# Patient Record
Sex: Male | Born: 1955
Health system: Southern US, Community
[De-identification: ages and names within clinical notes are randomized; demographics above are authoritative.]

## PROBLEM LIST (undated history)

## (undated) DIAGNOSIS — K922 Gastrointestinal hemorrhage, unspecified: Secondary | ICD-10-CM

## (undated) DIAGNOSIS — Z8719 Personal history of other diseases of the digestive system: Secondary | ICD-10-CM

## (undated) DIAGNOSIS — N400 Enlarged prostate without lower urinary tract symptoms: Secondary | ICD-10-CM

## (undated) DIAGNOSIS — R972 Elevated prostate specific antigen [PSA]: Secondary | ICD-10-CM

## (undated) DIAGNOSIS — E785 Hyperlipidemia, unspecified: Secondary | ICD-10-CM

## (undated) DIAGNOSIS — R7611 Nonspecific reaction to tuberculin skin test without active tuberculosis: Secondary | ICD-10-CM

## (undated) DIAGNOSIS — R030 Elevated blood-pressure reading, without diagnosis of hypertension: Secondary | ICD-10-CM

## (undated) DIAGNOSIS — M1990 Unspecified osteoarthritis, unspecified site: Secondary | ICD-10-CM

## (undated) DIAGNOSIS — I10 Essential (primary) hypertension: Secondary | ICD-10-CM

## (undated) HISTORY — PX: COLONOSCOPY: SHX174

## (undated) HISTORY — DX: Personal history of other diseases of the digestive system: Z87.19

## (undated) HISTORY — DX: Hyperlipidemia, unspecified: E78.5

## (undated) HISTORY — DX: Elevated blood-pressure reading, without diagnosis of hypertension: R03.0

## (undated) HISTORY — PX: GASTROSCHISIS CLOSURE: SHX1700

## (undated) HISTORY — DX: Benign prostatic hyperplasia without lower urinary tract symptoms: N40.0

## (undated) HISTORY — PX: PROSTATE BIOPSY: SHX241

## (undated) HISTORY — DX: Essential (primary) hypertension: I10

## (undated) HISTORY — DX: Nonspecific reaction to tuberculin skin test without active tuberculosis: R76.11

## (undated) HISTORY — DX: Unspecified osteoarthritis, unspecified site: M19.90

## (undated) HISTORY — DX: Gastrointestinal hemorrhage, unspecified: K92.2

---
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
## 2003-11-07 ENCOUNTER — Inpatient Hospital Stay (HOSPITAL_COMMUNITY): Admission: EM | Admit: 2003-11-07 | Discharge: 2003-11-10 | Payer: Self-pay | Admitting: Emergency Medicine

## 2003-11-23 ENCOUNTER — Encounter: Admission: RE | Admit: 2003-11-23 | Discharge: 2003-11-23 | Payer: Self-pay | Admitting: *Deleted

## 2003-12-14 ENCOUNTER — Encounter: Admission: RE | Admit: 2003-12-14 | Discharge: 2003-12-14 | Payer: Self-pay | Admitting: *Deleted

## 2004-07-10 HISTORY — PX: COLONOSCOPY: SHX174

## 2005-07-04 IMAGING — CR DG ABDOMEN 1V
1 series · 1 of 1 positions shown · non-contrast
Comparison: none

CLINICAL DATA: Rectal bleeding.  
SUPINE VIEW ABDOMEN
The patient had a recent barium enema.  Please see report from such.  Now noted is residual barium throughout the colon.  Exam is limited with respect to evaluating for the possibility of malrotation. Slightly dilated gas filled small bowel loops right lower quadrant of the abdomen. 
IMPRESSION
Residual barium throughout the colon.

[view not recorded]
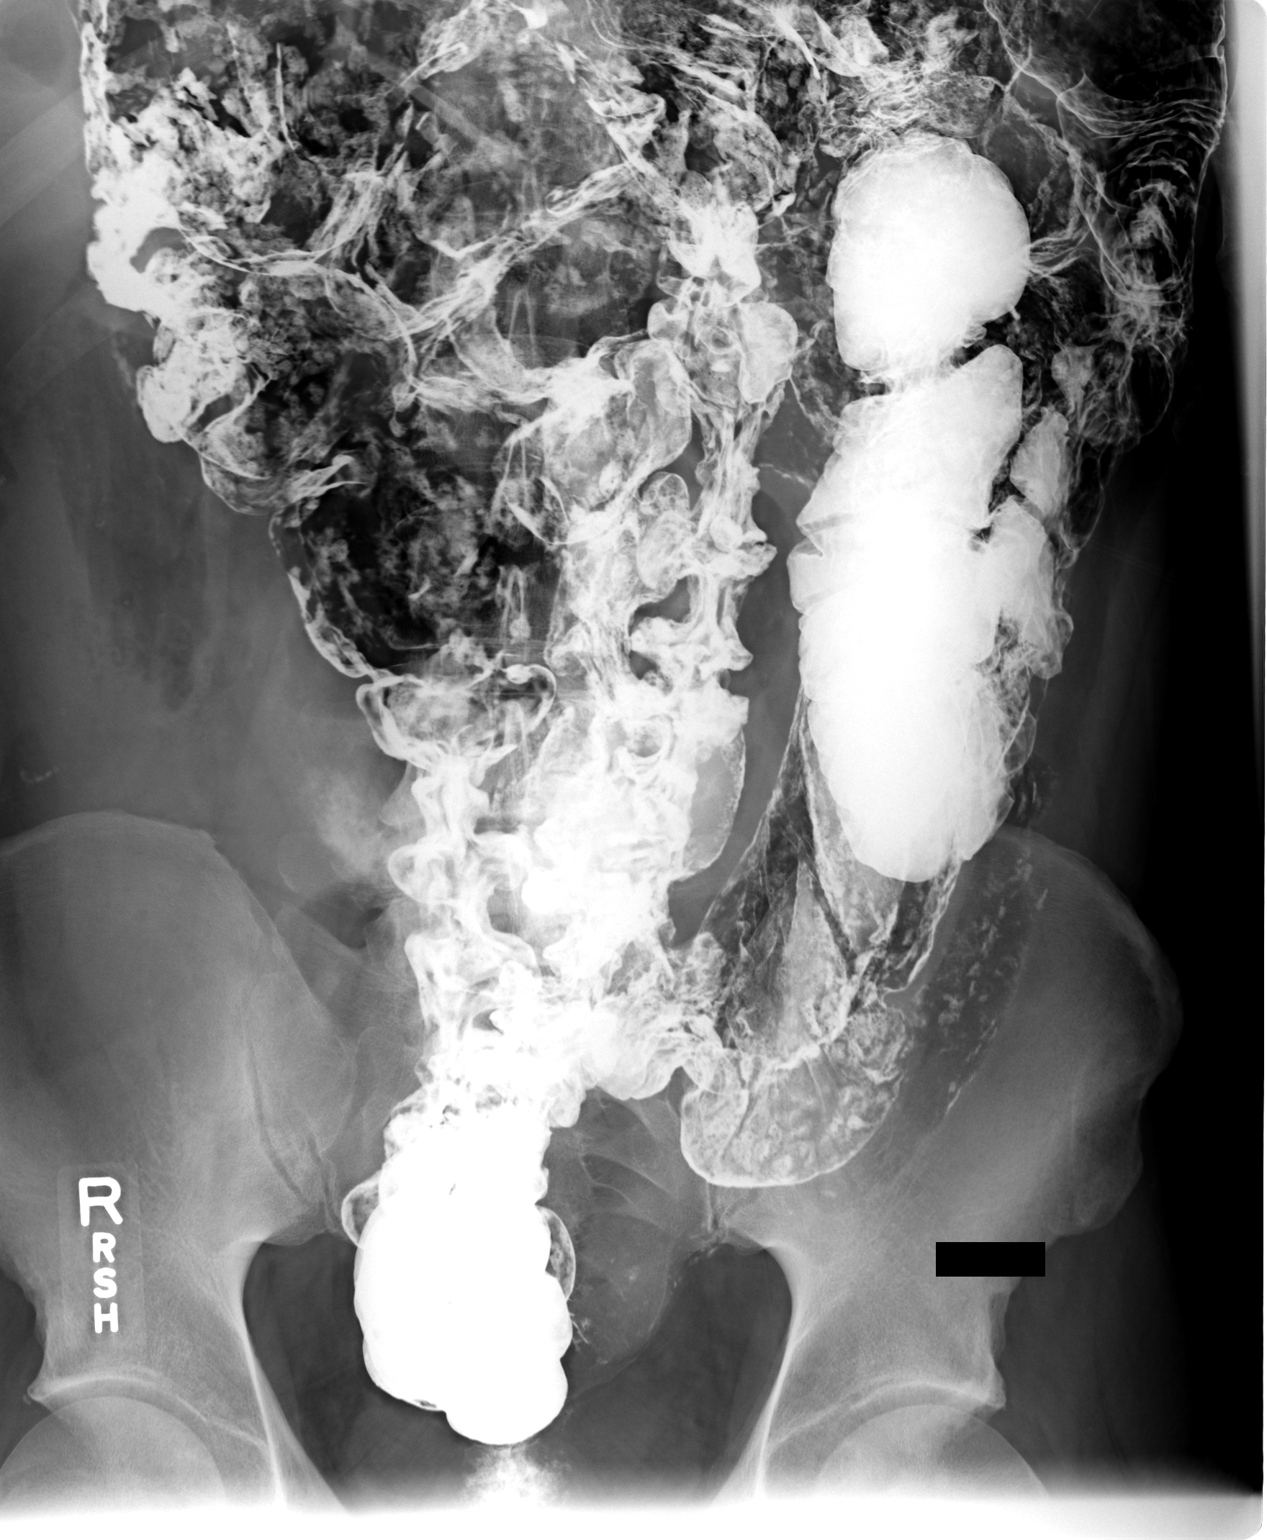

[1 of 1 positions shown; findings below may reference images not displayed]

## 2005-07-18 IMAGING — CR DG ABDOMEN 1V
1 series · 1 of 1 positions shown · non-contrast
Comparison: none

CLINICAL DATA: Hematochezia.
 SINGLE VIEW ABDOMEN ? 11/23/03 
 A supine radiograph of the abdomen was obtained as a preliminary film for a scheduled small bowel series.  This demonstrates stool and retained barium throughout the colon.  Again demonstrated is a more medial location of the right colon than expected, also seen on the barium enema dated 11/09/03.  Unremarkable bones.  
 IMPRESSION
 1.  Stool and retained barium throughout the colon, precluding a detailed small bowel series at this time.  This is being rescheduled following colon cleansing.
 2.  Continued abnormal position of the right colon suggesting malrotation.

[view not recorded]
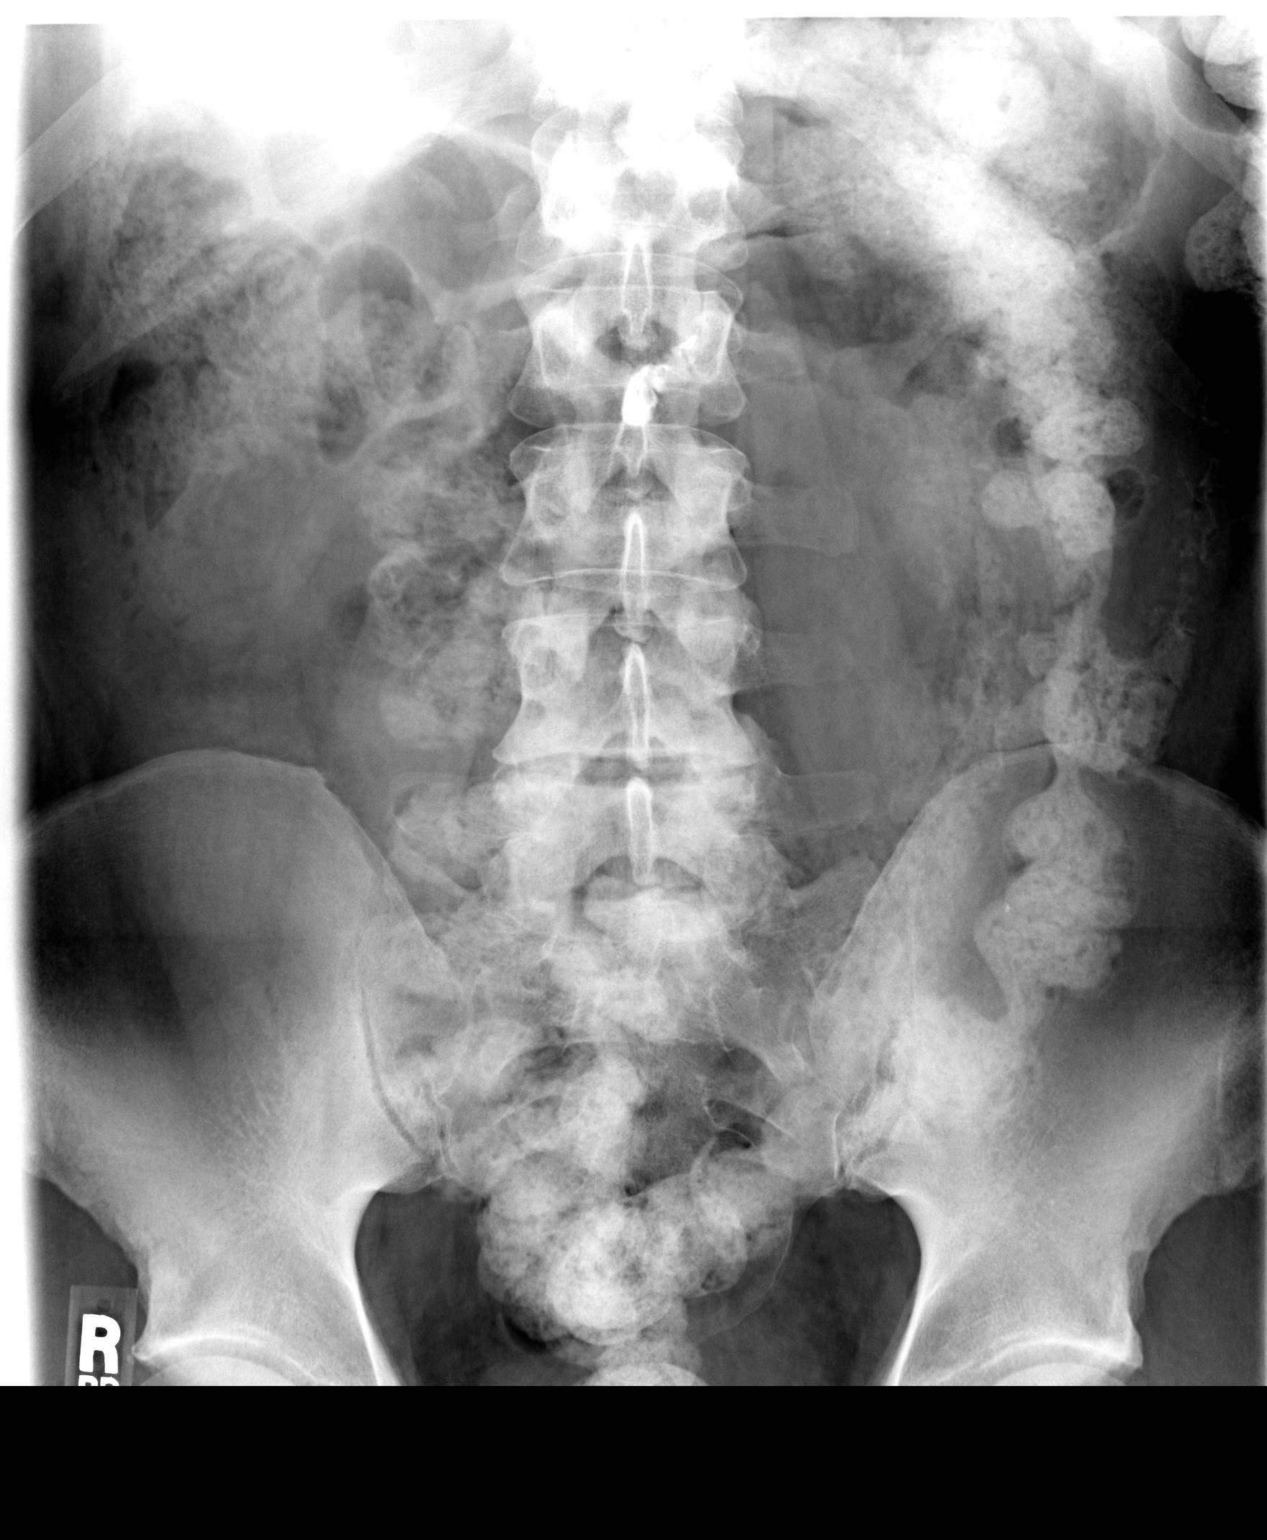

[1 of 1 positions shown; findings below may reference images not displayed]

## 2013-07-10 HISTORY — PX: HEMANGIOMA EXCISION: SHX1734

## 2018-07-10 DIAGNOSIS — R972 Elevated prostate specific antigen [PSA]: Secondary | ICD-10-CM

## 2018-07-10 HISTORY — DX: Elevated prostate specific antigen (PSA): R97.20

## 2018-07-15 ENCOUNTER — Encounter: Payer: Self-pay | Admitting: Family Medicine

## 2018-07-15 ENCOUNTER — Ambulatory Visit: Payer: Self-pay | Admitting: Family Medicine

## 2018-07-15 VITALS — BP 174/71 | HR 73 | Temp 98.2°F | Resp 16 | Ht 73.0 in | Wt 230.4 lb

## 2018-07-15 DIAGNOSIS — E669 Obesity, unspecified: Secondary | ICD-10-CM | POA: Diagnosis not present

## 2018-07-15 DIAGNOSIS — Z0000 Encounter for general adult medical examination without abnormal findings: Secondary | ICD-10-CM

## 2018-07-15 DIAGNOSIS — Z1211 Encounter for screening for malignant neoplasm of colon: Secondary | ICD-10-CM

## 2018-07-15 DIAGNOSIS — Z23 Encounter for immunization: Secondary | ICD-10-CM | POA: Diagnosis not present

## 2018-07-15 DIAGNOSIS — Z125 Encounter for screening for malignant neoplasm of prostate: Secondary | ICD-10-CM

## 2018-07-15 NOTE — Progress Notes (Addendum)
Office Note 07/15/2018  CC:  Chief Complaint  Patient presents with  . Establish Care    Previous PCP: Dr. Barton Dubois at [facility]  . GI Problems  Pt is not fasting today.  HPI:  Michael Cox is a 63 y.o. male who is here to establish care Patient's most recent primary MD: see above.  [facility] for 15+ yrs after seeing Dr. Van Clines not have a PCP there. Old records were not available for review prior to or during today's visit.  His last CPE with labs was approx a couple years ago and was in [facility]. He got a physical through his employer health system. His exercise is working around the yard, nothing formal. Diet: increase in veggies and lower fat food the last couple years. Last eye exam 2 yrs ago, no problems. Dental UTD.  He had gastroschisis surgery at birth.  He had one episode hematochezia; dx's with upper and lower GI bleeding, Hb dropped but no transfusion required.  No surgery required.  Ultimately the bleed was blamed on ASA overuse and he has never bled again.  Pt states he has white coat syndrome.  His bp's at home are 130/70s consistently.  He reports that his bp is always high at MD office and dentist office.   Past Medical History:  Diagnosis Date  . Lower GI bleed    ? due to ASA  . 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  He says he had multiple x-rays and "they never found anything".  Suspect false positive TB skin test??  Marland Kitchen Upper GI bleed    due to use of ASA    Past Surgical History:  Procedure Laterality Date  . COLONOSCOPY  2006  . GASTROSCHISIS CLOSURE     at birth  . HEMANGIOMA EXCISION  2015   from 5th digit right hand  . TONSILLECTOMY  1963    Family History  Problem Relation Age of Onset  . Skin cancer Mother   . Hyperlipidemia Mother   . Heart attack Mother 30  . Lung cancer Father        smoker  . Arthritis Sister   . Alzheimer's disease  Maternal Grandmother   . Heart disease Maternal Grandmother   . Hyperlipidemia Maternal Grandmother   . Hypertension Maternal Grandmother   . Heart attack Maternal Grandmother   . Lung cancer Maternal Grandfather        smoker  . Heart attack Maternal Grandfather   . Stomach cancer Paternal Grandmother   . Heart attack Paternal Grandfather 56    Social History   Socioeconomic History  . Marital status: Married    Spouse name: Not on file  . Number of children: Not on file  . Years of education: Not on file  . Highest education level: Not on file  Occupational History  . Not on file  Social Needs  . Financial resource strain: Not on file  . Food insecurity:    Worry: Not on file    Inability: Not on file  . Transportation needs:    Medical: Not on file    Non-medical: Not on file  Tobacco Use  . Smoking status: Never Smoker  . Smokeless tobacco: Never Used  Substance and Sexual Activity  . Alcohol use: Not on file    Comment: 1-2 glasses of wine daily  . Drug use: Never  . Sexual  activity: Not on file  Lifestyle  . Physical activity:    Days per week: Not on file    Minutes per session: Not on file  . Stress: Not on file  Relationships  . Social connections:    Talks on phone: Not on file    Gets together: Not on file    Attends religious service: Not on file    Active member of club or organization: Not on file    Attends meetings of clubs or organizations: Not on file    Relationship status: Not on file  . Intimate partner violence:    Fear of current or ex partner: Not on file    Emotionally abused: Not on file    Physically abused: Not on file    Forced sexual activity: Not on file  Other Topics Concern  . Not on file  Social History Narrative   Married, 3 sons---[name], Angelia Mouldndrew, Benjamin.   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   No tob/drugs.   Alc: 1 glass wine per evening.    Outpatient  Encounter Medications as of 07/15/2018  Medication Sig  . Multiple Vitamin (MULTIVITAMIN) tablet Take 1 tablet by mouth daily.  . Omega-3 Fatty Acids (FISH OIL PO) Take 1 capsule by mouth daily.   No facility-administered encounter medications on file as of 07/15/2018.     No Known Allergies  ROS Review of Systems  Constitutional: Negative for appetite change, chills, fatigue and fever.  HENT: Negative for congestion, dental problem, ear pain and sore throat.   Eyes: Negative for discharge, redness and visual disturbance.  Respiratory: Negative for cough, chest tightness, shortness of breath and wheezing.   Cardiovascular: Negative for chest pain, palpitations and leg swelling.  Gastrointestinal: Negative for abdominal pain, blood in stool, diarrhea, nausea and vomiting.  Genitourinary: Negative for difficulty urinating, dysuria, flank pain, frequency, hematuria and urgency.  Musculoskeletal: Negative for arthralgias, back pain, joint swelling, myalgias and neck stiffness.  Skin: Negative for pallor and rash.  Neurological: Negative for dizziness, speech difficulty, weakness and headaches.  Hematological: Negative for adenopathy. Does not bruise/bleed easily.  Psychiatric/Behavioral: Negative for confusion and sleep disturbance. The patient is not nervous/anxious.     PE; Blood pressure (!) 174/71, pulse 73, temperature 98.2 F (36.8 C), temperature source Oral, resp. rate 16, height 6\' 1"  (1.854 m), weight 230 lb 6 oz (104.5 kg), SpO2 98 %. Gen: Alert, well appearing.  Patient is oriented to person, place, time, and situation. AFFECT: pleasant, lucid thought and speech. ENT: Ears: EACs clear, normal epithelium.  TMs with good light reflex and landmarks bilaterally.  Eyes: no injection, icteris, swelling, or exudate.  EOMI, PERRLA. Nose: no drainage or turbinate edema/swelling.  No injection or focal lesion.  Mouth: lips without lesion/swelling.  Oral mucosa pink and moist.  Dentition  intact and without obvious caries or gingival swelling.  Oropharynx without erythema, exudate, or swelling.  Neck: supple/nontender.  No LAD, mass, or TM.  Carotid pulses 2+ bilaterally, without bruits. CV: RRR, no m/r/g.   LUNGS: CTA bilat, nonlabored resps, good aeration in all lung fields. ABD: soft, NT, ND, BS normal.  No hepatospenomegaly or mass.  No bruits. EXT: no clubbing, cyanosis, or edema.  Musculoskeletal: no joint swelling, erythema, warmth, or tenderness.  ROM of all joints intact. Skin - no sores or suspicious lesions or rashes or color changes Rectal exam: negative without mass, lesions or tenderness, PROSTATE EXAM: smooth and symmetric without  nodules or tenderness.   Pertinent labs:  None today  ASSESSMENT AND PLAN:   New pt; no old records to obtain.  1) Health maintenance exam: Reviewed age and gender appropriate health maintenance issues (prudent diet, regular exercise, health risks of tobacco and excessive alcohol, use of seatbelts, fire alarms in home, use of sunscreen).  Also reviewed age and gender appropriate health screening as well as vaccine recommendations. Vaccines: Tdap UTD.  Flu vaccine today.  Shingrix #1 given today. Labs: he'll return when fasting for CBC w/diff, CMET, FLP, and PSA. Prostate ca screening: DRE normal today, PSA future. Colon ca screening: last colonoscopy 2006 per pt report.  Will refer to [facility] for repeat colonoscopy.  2) White coat HTN: continue periodic home bp monitoring.   3) Regarding his hx of positive TB test: I am finding out the price of quantiferon gold tb test and the cost of [place]oux skin testing.   If quantiferon gold test is done and is neg-->end of concern. If       "             "                                    Pos-->proceed with CXR.  Pt will need approp tx for latent TB at the very least. If TB skin test is done (b/c Quant gold too expensive) and it returns neg-->end of concern. If       "             "                                                                                     Pos-->then we're right back where we started, and the next step would be to check CXR and if neg then treat for latent TB.  An After Visit Summary was printed and given to the patient.  Return in about 1 year (around 07/16/2019) for annual CPE (fasting)-->also needs fasting lab appt at his earliest convenience.  Signed:  Santiago Bumpers, MD           07/15/2018

## 2018-07-15 NOTE — Patient Instructions (Signed)

## 2018-07-26 ENCOUNTER — Encounter: Payer: Self-pay | Admitting: Gastroenterology

## 2018-07-29 ENCOUNTER — Other Ambulatory Visit (INDEPENDENT_AMBULATORY_CARE_PROVIDER_SITE_OTHER): Payer: BLUE CROSS/BLUE SHIELD

## 2018-07-29 DIAGNOSIS — Z125 Encounter for screening for malignant neoplasm of prostate: Secondary | ICD-10-CM

## 2018-07-29 DIAGNOSIS — Z0000 Encounter for general adult medical examination without abnormal findings: Secondary | ICD-10-CM

## 2018-07-29 LAB — LIPID PANEL
Cholesterol: 202 mg/dL — ABNORMAL HIGH (ref 0–200)
HDL: 41.7 mg/dL (ref >39.00)
NonHDL: 160.56
Total CHOL/HDL Ratio: 5
Triglycerides: 290 mg/dL — ABNORMAL HIGH (ref 0.0–149.0)
VLDL: 58 mg/dL — ABNORMAL HIGH (ref 0.0–40.0)

## 2018-07-29 LAB — CBC WITH DIFFERENTIAL/PLATELET
Basophils Absolute: 0.1 10*3/uL (ref 0.0–0.1)
Basophils Relative: 0.7 % (ref 0.0–3.0)
Eosinophils Absolute: 0.1 10*3/uL (ref 0.0–0.7)
Eosinophils Relative: 1.5 % (ref 0.0–5.0)
HCT: 45.7 % (ref 39.0–52.0)
Hemoglobin: 16 g/dL (ref 13.0–17.0)
Lymphocytes Relative: 43.1 % (ref 12.0–46.0)
Lymphs Abs: 3.7 10*3/uL (ref 0.7–4.0)
MCHC: 35.1 g/dL (ref 30.0–36.0)
MCV: 92.8 fl (ref 78.0–100.0)
Monocytes Absolute: 0.3 10*3/uL (ref 0.1–1.0)
Monocytes Relative: 3.3 % (ref 3.0–12.0)
Neutro Abs: 4.4 10*3/uL (ref 1.4–7.7)
Neutrophils Relative %: 51.4 % (ref 43.0–77.0)
Platelets: 166 10*3/uL (ref 150.0–400.0)
RBC: 4.92 Mil/uL (ref 4.22–5.81)
RDW: 12.5 % (ref 11.5–15.5)
WBC: 8.6 10*3/uL (ref 4.0–10.5)

## 2018-07-29 LAB — LDL CHOLESTEROL, DIRECT: Direct LDL: 125 mg/dL

## 2018-07-29 LAB — COMPREHENSIVE METABOLIC PANEL
ALT: 14 U/L (ref 0–53)
AST: 34 U/L (ref 0–37)
Albumin: 4.1 g/dL (ref 3.5–5.2)
Alkaline Phosphatase: 82 U/L (ref 39–117)
BUN: 8 mg/dL (ref 6–23)
CO2: 29 mEq/L (ref 19–32)
Calcium: 8.9 mg/dL (ref 8.4–10.5)
Chloride: 103 mEq/L (ref 96–112)
Creatinine, Ser: 1.02 mg/dL (ref 0.40–1.50)
GFR: 73.35 mL/min (ref >60.00)
Glucose, Bld: 104 mg/dL — ABNORMAL HIGH (ref 70–99)
Potassium: 4.6 mEq/L (ref 3.5–5.1)
Sodium: 140 mEq/L (ref 135–145)
Total Bilirubin: 0.7 mg/dL (ref 0.2–1.2)
Total Protein: 6.1 g/dL (ref 6.0–8.3)

## 2018-07-29 LAB — PSA: PSA: 4.14 ng/mL — ABNORMAL HIGH (ref 0.10–4.00)

## 2018-07-30 ENCOUNTER — Other Ambulatory Visit: Payer: Self-pay

## 2018-07-30 DIAGNOSIS — R972 Elevated prostate specific antigen [PSA]: Secondary | ICD-10-CM

## 2018-07-30 DIAGNOSIS — E785 Hyperlipidemia, unspecified: Secondary | ICD-10-CM

## 2018-07-30 MED ORDER — ATORVASTATIN CALCIUM 20 MG PO TABS: 20.0000 mg | ORAL_TABLET | Freq: Every day | ORAL | 2 refills | Status: DC

## 2018-08-07 ENCOUNTER — Encounter: Payer: Self-pay | Admitting: Gastroenterology

## 2018-08-07 ENCOUNTER — Ambulatory Visit (AMBULATORY_SURGERY_CENTER): Payer: Self-pay

## 2018-08-07 ENCOUNTER — Other Ambulatory Visit: Payer: Self-pay

## 2018-08-07 ENCOUNTER — Telehealth: Payer: Self-pay

## 2018-08-07 VITALS — Ht 74.0 in | Wt 229.8 lb

## 2018-08-07 DIAGNOSIS — Z1211 Encounter for screening for malignant neoplasm of colon: Secondary | ICD-10-CM

## 2018-08-07 MED ORDER — NA SULFATE-K SULFATE-MG SULF 17.5-3.13-1.6 GM/177ML PO SOLN: 1.0000 | Freq: Once | ORAL | 0 refills | Status: AC

## 2018-08-07 NOTE — Telephone Encounter (Signed)
I think okay to proceed at [facility]. His anatomy is altered and may be a challenging exam from review of prior barium studies, but not a reason to be done at the hospital. Thanks

## 2018-08-07 NOTE — Telephone Encounter (Signed)
Patient had previsit this morning. Pt had last colonoscopy 15 years ago at [place]. Pt went in to er for GI bleed and they did his colonoscopy while in hospital. Pt verbalize he has Gastroschisis. Pt said he has two digestive systems. Is it okay to still have procedure at [facility]?

## 2018-08-07 NOTE — Telephone Encounter (Signed)
Noted, Thanks

## 2018-08-07 NOTE — Progress Notes (Signed)
No egg or soy allergy known to patient  No issues with past sedation with any surgeries  or procedures, no intubation problems  No diet pills per patient No home 02 use per patient  No blood thinners per patient  Pt denies issues with constipation  No A fib or A flutter  EMMI video sent to pt's e mail , pt declined Pt has gastroschisis notified Dr. Adela Lank.

## 2018-08-15 ENCOUNTER — Encounter: Payer: BLUE CROSS/BLUE SHIELD | Admitting: Gastroenterology

## 2018-09-09 ENCOUNTER — Encounter: Payer: BLUE CROSS/BLUE SHIELD | Admitting: Gastroenterology

## 2018-09-30 ENCOUNTER — Telehealth: Payer: Self-pay

## 2018-09-30 NOTE — Telephone Encounter (Signed)
Pt scheduled for a direct colon in the [facility] on 3-26 with Dr. Adela Lank. Due to COVID-19 tenatively rescheduled procedure to 11-08-2018 at 11:30am. LM for pt and asked him to call back to confirm. He has already had previsit.  Will just need to discuss new time frames for arrival and prep.

## 2018-10-03 ENCOUNTER — Encounter: Payer: BLUE CROSS/BLUE SHIELD | Admitting: Gastroenterology

## 2018-10-24 ENCOUNTER — Telehealth: Payer: Self-pay | Admitting: *Deleted

## 2018-10-24 NOTE — Telephone Encounter (Signed)
No answer left message for patient regarding canceling his procedure related to COVID 19. WiIl cancel patient and call back to reschedule MID May. SM

## 2018-10-26 ENCOUNTER — Other Ambulatory Visit: Payer: Self-pay | Admitting: Family Medicine

## 2018-11-08 ENCOUNTER — Encounter: Payer: BLUE CROSS/BLUE SHIELD | Admitting: Gastroenterology

## 2018-11-19 ENCOUNTER — Telehealth: Payer: Self-pay | Admitting: *Deleted

## 2018-11-19 DIAGNOSIS — Z1211 Encounter for screening for malignant neoplasm of colon: Secondary | ICD-10-CM

## 2018-11-19 MED ORDER — NA SULFATE-K SULFATE-MG SULF 17.5-3.13-1.6 GM/177ML PO SOLN: ORAL | 0 refills | Status: DC

## 2018-11-19 NOTE — Telephone Encounter (Signed)
Spoke with patient. Rescheduled his colonoscopy for 12/23/2018 at 10:30 am with Dr.Armbruster. resent suprep rx to pt's pharmacy per his request and mailed new instructions. Pt is aware.

## 2018-12-20 ENCOUNTER — Telehealth: Payer: Self-pay

## 2018-12-20 NOTE — Telephone Encounter (Signed)
Covid-19 screening questions  Have you traveled in the last 14 days? No. If yes where?  Do you now or have you had a fever in the last 14 days? No.  Do you have any respiratory symptoms of shortness of breath or cough now or in the last 14 days? No.  Do you have any family members or close contacts with diagnosed or suspected Covid-19 in the past 14 days? No.  Have you been tested for Covid-19 and found to be positive? No.       

## 2018-12-23 ENCOUNTER — Encounter: Payer: Self-pay | Admitting: Gastroenterology

## 2018-12-23 ENCOUNTER — Ambulatory Visit (AMBULATORY_SURGERY_CENTER): Payer: BC Managed Care – PPO | Admitting: Gastroenterology

## 2018-12-23 ENCOUNTER — Other Ambulatory Visit: Payer: Self-pay

## 2018-12-23 VITALS — BP 131/68 | HR 63 | Temp 98.3°F | Resp 25 | Ht 74.0 in | Wt 229.0 lb

## 2018-12-23 DIAGNOSIS — K922 Gastrointestinal hemorrhage, unspecified: Secondary | ICD-10-CM | POA: Diagnosis not present

## 2018-12-23 DIAGNOSIS — K9189 Other postprocedural complications and disorders of digestive system: Secondary | ICD-10-CM | POA: Diagnosis not present

## 2018-12-23 DIAGNOSIS — Z538 Procedure and treatment not carried out for other reasons: Secondary | ICD-10-CM

## 2018-12-23 DIAGNOSIS — Z1211 Encounter for screening for malignant neoplasm of colon: Secondary | ICD-10-CM | POA: Diagnosis not present

## 2018-12-23 DIAGNOSIS — K529 Noninfective gastroenteritis and colitis, unspecified: Secondary | ICD-10-CM

## 2018-12-23 MED ORDER — SODIUM CHLORIDE 0.9 % IV SOLN: 500.0000 mL | Freq: Once | INTRAVENOUS | Status: DC

## 2018-12-23 NOTE — Op Note (Signed)
[facility] Patient Name: Michael Cox Procedure Date: 12/23/2018 11:14 AM MRN: [id number] Endoscopist: Remo Lipps P. Havery Moros , MD Age: 63 Referring MD:  Date of Birth: 06-Sep-1955 Gender: Male Account #: [id number] Procedure:                Colonoscopy Indications:              Screening for colorectal malignant neoplasm,                            patient reports history of surgery for gastroshisis                            and GI bleeding as a child with altered anatomy Medicines:                Monitored Anesthesia Care Procedure:                Pre-Anesthesia Assessment:                           - Prior to the procedure, a History and Physical                            was performed, and patient medications and                            allergies were reviewed. The patient's tolerance of                            previous anesthesia was also reviewed. The risks                            and benefits of the procedure and the sedation                            options and risks were discussed with the patient.                            All questions were answered, and informed consent                            was obtained. Prior Anticoagulants: The patient has                            taken no previous anticoagulant or antiplatelet                            agents. ASA Grade Assessment: II - A patient with                            mild systemic disease. After reviewing the risks                            and benefits, the patient was deemed in  satisfactory condition to undergo the procedure.                           After obtaining informed consent, the colonoscope                            was passed under direct vision. Throughout the                            procedure, the patient's blood pressure, pulse, and                            oxygen saturations were monitored continuously. The                            Colonoscope  was introduced through the anus with                            the intention of advancing to the cecum. The scope                            was advanced to the transverse colon before the                            procedure was aborted. Medications were given. The                            colonoscopy was technically difficult and complex                            due to inadequate bowel prep. The patient tolerated                            the procedure well. The quality of the bowel                            preparation was poor. The rectum was photographed. Scope In: 11:21:56 AM Scope Out: 11:28:16 AM Total Procedure Duration: 0 hours 6 minutes 20 seconds  Findings:                 The perianal and digital rectal examinations were                            normal.                           A large amount of semi-liquid stool was found in                            the entire colon, interfering with visualization.                            The bowel prep was inadequate for screening  purposes.                           There was evidence of a prior end-to-side                            ileo-colonic anastomosis in the descending colon.                            The anatomy was altered. This was patent and was                            characterized by inflammation and some ulceration.                            Biopsies were taken with a cold forceps for                            histology. However the colon continued proximally                            and was stool filled through the transverse colon.                            The prep was poor and could not traverse the colon                            more proximally due to poor prep.                           The small bowel limb from the anastomosis was                            intubated and was erythematous. Biopsies were taken                            with a cold forceps for  histology.                           Internal hemorrhoids were found during                            retroflexion. The hemorrhoids were mild. Complications:            No immediate complications. Estimated blood loss:                            Minimal. Estimated Blood Loss:     Estimated blood loss was minimal. Impression:               - Preparation of the colon was poor.                           - Stool in the entire examined colon.                           -  Altered anatomy with surgical anastomosis in the                            descending colon entering into small bowel - the                            small bowel and anastomosis were inflamed, biopsies                            taken. The colon continued proximally but could not                            be traversed much due to significant stool burden                            and poor prep.                           - Internal hemorrhoids.                           Unfortunately the bowel prep prohibited safe and                            full evaluation of the colon. Recommendation:           - Patient has a contact number available for                            emergencies. The signs and symptoms of potential                            delayed complications were discussed with the                            patient. Return to normal activities tomorrow.                            Written discharge instructions were provided to the                            patient.                           - Resume previous diet.                           - Continue present medications.                           - Await pathology results.                           - Repeat colonoscopy because the bowel preparation  was suboptimal. Will discuss with the patient if he                            is willing to do a 2 day prep for this exam, versus                            another form of colon cancer  screening. Viviann Spare P. Sakira Dahmer, MD 12/23/2018 11:36:59 AM This report has been signed electronically.

## 2018-12-23 NOTE — Progress Notes (Signed)
Pt's states no medical or surgical changes since previsit or office visit.  Temp CW Vitals JB 

## 2018-12-23 NOTE — Patient Instructions (Addendum)
Please see scheduled appointments.   YOU HAD AN ENDOSCOPIC PROCEDURE TODAY AT [facility]:   Refer to the procedure report that was given to you for any specific questions about what was found during the examination.  If the procedure report does not answer your questions, please call your gastroenterologist to clarify.  If you requested that your care partner not be given the details of your procedure findings, then the procedure report has been included in a sealed envelope for you to review at your convenience later.  YOU SHOULD EXPECT: Some feelings of bloating in the abdomen. Passage of more gas than usual.  Walking can help get rid of the air that was put into your GI tract during the procedure and reduce the bloating. If you had a lower endoscopy (such as a colonoscopy or flexible sigmoidoscopy) you may notice spotting of blood in your stool or on the toilet paper. If you underwent a bowel prep for your procedure, you may not have a normal bowel movement for a few days.  Please Note:  You might notice some irritation and congestion in your nose or some drainage.  This is from the oxygen used during your procedure.  There is no need for concern and it should clear up in a day or so.  SYMPTOMS TO REPORT IMMEDIATELY:   Following lower endoscopy (colonoscopy or flexible sigmoidoscopy):  Excessive amounts of blood in the stool  Significant tenderness or worsening of abdominal pains  Swelling of the abdomen that is new, acute  Fever of 100F or higher  For urgent or emergent issues, a gastroenterologist can be reached at any hour by calling [phone].   DIET:  We do recommend a small meal at first, but then you may proceed to your regular diet.  Drink plenty of fluids but you should avoid alcoholic beverages for 24 hours.  ACTIVITY:  You should plan to take it easy for the rest of today and you should NOT DRIVE or use heavy machinery until tomorrow (because of the  sedation medicines used during the test).    FOLLOW UP: Our staff will call the number listed on your records 48-72 hours following your procedure to check on you and address any questions or concerns that you may have regarding the information given to you following your procedure. If we do not reach you, we will leave a message.  We will attempt to reach you two times.  During this call, we will ask if you have developed any symptoms of COVID 19. If you develop any symptoms (ie: fever, flu-like symptoms, shortness of breath, cough etc.) before then, please call [phone].  If you test positive for Covid 19 in the 2 weeks post procedure, please call and report this information to [place].    If any biopsies were taken you will be contacted by phone or by letter within the next 1-3 weeks.  Please call us at [phone] if you have not heard about the biopsies in 3 weeks.    SIGNATURES/CONFIDENTIALITY: You and/or your care partner have signed paperwork which will be entered into your electronic medical record.  These signatures attest to the fact that that the information above on your After Visit Summary has been reviewed and is understood.  Full responsibility of the confidentiality of this discharge information lies with you and/or your care-partner.

## 2018-12-23 NOTE — Progress Notes (Signed)
Report given to PACU, vss 

## 2018-12-23 NOTE — Progress Notes (Signed)
Called to room to assist during endoscopic procedure.  Patient ID and intended procedure confirmed with present staff. Received instructions for my participation in the procedure from the performing physician.  

## 2018-12-25 ENCOUNTER — Telehealth: Payer: Self-pay

## 2018-12-25 ENCOUNTER — Telehealth: Payer: Self-pay | Admitting: *Deleted

## 2018-12-25 NOTE — Telephone Encounter (Signed)
  Follow up Call-  Call back number 12/23/2018  Post procedure Call Back phone  # [phone]  Permission to leave phone message Yes  Some recent data might be hidden     Left message

## 2018-12-25 NOTE — Telephone Encounter (Signed)
No answer. Name identifier. Message left to call if any questions or concerns and we will make an attempt to call later in the day.

## 2019-01-20 ENCOUNTER — Other Ambulatory Visit: Payer: Self-pay | Admitting: Family Medicine

## 2019-01-20 NOTE — Telephone Encounter (Signed)
LM for pt to CB for fasting lab appointment.

## 2019-01-21 ENCOUNTER — Other Ambulatory Visit: Payer: Self-pay

## 2019-01-21 ENCOUNTER — Ambulatory Visit: Payer: BC Managed Care – PPO | Admitting: *Deleted

## 2019-01-21 VITALS — Ht 75.0 in | Wt 230.0 lb

## 2019-01-21 DIAGNOSIS — Z1211 Encounter for screening for malignant neoplasm of colon: Secondary | ICD-10-CM

## 2019-01-21 MED ORDER — SUPREP BOWEL PREP KIT 17.5-3.13-1.6 GM/177ML PO SOLN: 1.0000 | Freq: Once | ORAL | 0 refills | Status: AC

## 2019-01-21 NOTE — Progress Notes (Signed)
No egg or soy allergy known to patient  No issues with past sedation with any surgeries  or procedures, no intubation problems  No diet pills per patient No home 02 use per patient  No blood thinners per patient  Pt denies issues with constipation  No A fib or A flutter  EMMI video sent to pt's e mail  2 day colon prep as per SA- last colon 6-20 poor prep   Pt verified name, DOB, address and insurance during PV today. Pt mailed instruction packet to included paper to complete and mail back to [facility] with addressed and stamped envelope, Emmi video, copy of consent form to read and not return, and instructions. Suprep $15  coupon mailed in packet. PV completed over the phone. Pt encouraged to call with questions or issues   Pt is aware that care partner will wait in the car during procedure; if they feel like they will be too hot to wait in the car; they may wait in the lobby.  We want them to wear a mask (we do not have any that we can provide them), practice social distancing, and we will check their temperatures when they get here.  I did remind patient that their care partner needs to stay in the parking lot the entire time. Pt will wear mask into building.

## 2019-02-01 ENCOUNTER — Encounter: Payer: Self-pay | Admitting: Family Medicine

## 2019-02-04 ENCOUNTER — Encounter: Payer: BC Managed Care – PPO | Admitting: Gastroenterology

## 2019-02-05 ENCOUNTER — Telehealth: Payer: Self-pay | Admitting: Family Medicine

## 2019-02-05 ENCOUNTER — Encounter: Payer: Self-pay | Admitting: Family Medicine

## 2019-02-05 ENCOUNTER — Other Ambulatory Visit: Payer: Self-pay

## 2019-02-05 ENCOUNTER — Ambulatory Visit (INDEPENDENT_AMBULATORY_CARE_PROVIDER_SITE_OTHER): Payer: BC Managed Care – PPO | Admitting: Family Medicine

## 2019-02-05 DIAGNOSIS — E785 Hyperlipidemia, unspecified: Secondary | ICD-10-CM | POA: Diagnosis not present

## 2019-02-05 DIAGNOSIS — R972 Elevated prostate specific antigen [PSA]: Secondary | ICD-10-CM | POA: Diagnosis not present

## 2019-02-05 LAB — LIPID PANEL
Cholesterol: 148 mg/dL (ref 0–200)
HDL: 48.1 mg/dL (ref >39.00)
LDL Cholesterol: 70 mg/dL (ref 0–99)
NonHDL: 100.29
Total CHOL/HDL Ratio: 3
Triglycerides: 152 mg/dL — ABNORMAL HIGH (ref 0.0–149.0)
VLDL: 30.4 mg/dL (ref 0.0–40.0)

## 2019-02-05 LAB — PSA: PSA: 3.51 ng/mL (ref 0.10–4.00)

## 2019-02-05 NOTE — Telephone Encounter (Signed)
Pls notify pt that his PSA level decreased from 4.1 to 3.5->back into normal range. Also, his cholesterol is MUCH improved->all normal now.   Continue cholesterol med at current dose every day. Plan on labs recheck at CPE in about 6 mo or so.-thx

## 2019-02-06 NOTE — Telephone Encounter (Signed)
Pt was advised and verbalized understanding.

## 2019-02-17 ENCOUNTER — Telehealth: Payer: Self-pay | Admitting: Gastroenterology

## 2019-02-17 NOTE — Telephone Encounter (Signed)

## 2019-02-18 ENCOUNTER — Other Ambulatory Visit: Payer: Self-pay

## 2019-02-18 ENCOUNTER — Encounter: Payer: Self-pay | Admitting: Gastroenterology

## 2019-02-18 ENCOUNTER — Ambulatory Visit (AMBULATORY_SURGERY_CENTER): Payer: BC Managed Care – PPO | Admitting: Gastroenterology

## 2019-02-18 VITALS — BP 119/75 | HR 60 | Temp 98.0°F | Resp 24 | Ht 75.0 in | Wt 230.0 lb

## 2019-02-18 DIAGNOSIS — Z538 Procedure and treatment not carried out for other reasons: Secondary | ICD-10-CM | POA: Diagnosis not present

## 2019-02-18 DIAGNOSIS — Z1211 Encounter for screening for malignant neoplasm of colon: Secondary | ICD-10-CM | POA: Diagnosis not present

## 2019-02-18 MED ORDER — SODIUM CHLORIDE 0.9 % IV SOLN: 500.0000 mL | Freq: Once | INTRAVENOUS | Status: DC

## 2019-02-18 NOTE — Progress Notes (Signed)
Pt's states no medical or surgical changes since previsit or office visit. 

## 2019-02-18 NOTE — Progress Notes (Signed)
To PACU, VSS. Report to Rn.tb 

## 2019-02-18 NOTE — Op Note (Signed)
[place] Patient Name: Suleiman Finigan Procedure Date: 02/18/2019 7:29 AM MRN: [id number] Endoscopist: Remo Lipps P. Havery Moros , MD Age: 63 Referring MD:  Date of Birth: 1956/03/24 Gender: Male Account #: [id number] Procedure:                Colonoscopy Indications:              Screening for colorectal malignant neoplasm,                            history of gastroschisis with surgery as a child                            for bleeding, altered anatomy with small bowel /                            colonic anastomosis in the left colon with colon in                            continuity otherwise, last exam limited by poor                            prep, now here for secreening following double prep Medicines:                Monitored Anesthesia Care Procedure:                Pre-Anesthesia Assessment:                           - Prior to the procedure, a History and Physical                            was performed, and patient medications and                            allergies were reviewed. The patient's tolerance of                            previous anesthesia was also reviewed. The risks                            and benefits of the procedure and the sedation                            options and risks were discussed with the patient.                            All questions were answered, and informed consent                            was obtained. Prior Anticoagulants: The patient has                            taken no previous anticoagulant or antiplatelet  agents. ASA Grade Assessment: II - A patient with                            mild systemic disease. After reviewing the risks                            and benefits, the patient was deemed in                            satisfactory condition to undergo the procedure.                           After obtaining informed consent, the colonoscope                            was passed  under direct vision. Throughout the                            procedure, the patient's blood pressure, pulse, and                            oxygen saturations were monitored continuously. The                            Colonoscope was introduced through the anus with                            the intention of advancing to the cecum. The scope                            was advanced to the hepatic flexure before the                            procedure was aborted. Medications were given. The                            colonoscopy was technically difficult and complex                            due to inadequate bowel prep. The patient tolerated                            the procedure well. The quality of the bowel                            preparation was inadequate. The rectum was                            photographed. Scope In: 7:37:15 AM Scope Out: 7:54:59 AM Total Procedure Duration: 0 hours 17 minutes 44 seconds  Findings:                 The perianal and digital rectal examinations were  normal.                           There was evidence of a prior end-to-side                            ileo-colonic anastomosis in the descending colon.                            This was patent and was characterized by erythema                            and suspected inflammatory polyps. This was                            biopsied on the last exam without any evidence of                            chronicity or IBD.                           The examined small bowel limb had some mild                            erythema but otherwise normal - biopsies not taken                            given this was done on the last exam and benign.                           A large amount of semi-liquid stool was found in                            the entire colon, making visualization difficult.                            Lavage of the area was performed using copious                             amounts of sterile water, resulting in incomplete                            clearance with fair visualization. I could traverse                            the colonoscope up to the hepatic flexure / distal                            ascending colon - no obvious polyps or mass lesions                            noted however the prep became progressively worse  and could not visualize well enough to achieve                            cecal intubation                           The exam was otherwise normal throughout the                            examined colon. While the prep was not adequate                            enough to visualize flat or smaller polyps - no                            obvious large polyps or mass lesions appreciated. Complications:            No immediate complications. Estimated blood loss:                            None. Estimated Blood Loss:     Estimated blood loss: none. Impression:               - Preparation of the colon was inadequate despite                            double prep - unfortunately given the patient's                            anatomy, I do not think he will be able to clear                            his right colon adequately enough for screening                            purposes.                           - Patent end-to-side ileo-colonic anastomosis,                            characterized by erythema and suspected                            inflammatory polyps.                           - The examined portion of the small bowel was                            mildly erythematous.                           Will discuss options with patient, could consider  virtual colonoscopy to exclude large polyps in the                            right colon however again optical colonoscopy                            unlikely to be successful in the future for this                             patient. Recommendation:           - Patient has a contact number available for                            emergencies. The signs and symptoms of potential                            delayed complications were discussed with the                            patient. Return to normal activities tomorrow.                            Written discharge instructions were provided to the                            patient.                           - Resume previous diet.                           - Continue present medications.                           - Will discuss options as outlined above with the                            patient Willaim RayasSteven P. Kayven Aldaco, MD 02/18/2019 8:04:44 AM This report has been signed electronically.

## 2019-02-18 NOTE — Patient Instructions (Signed)
YOU HAD AN ENDOSCOPIC PROCEDURE TODAY AT THE [facility]:   Refer to the procedure report that was given to you for any specific questions about what was found during the examination.  If the procedure report does not answer your questions, please call your gastroenterologist to clarify.  If you requested that your care partner not be given the details of your procedure findings, then the procedure report has been included in a sealed envelope for you to review at your convenience later.  YOU SHOULD EXPECT: Some feelings of bloating in the abdomen. Passage of more gas than usual.  Walking can help get rid of the air that was put into your GI tract during the procedure and reduce the bloating. If you had a lower endoscopy (such as a colonoscopy or flexible sigmoidoscopy) you may notice spotting of blood in your stool or on the toilet paper. If you underwent a bowel prep for your procedure, you may not have a normal bowel movement for a few days.  Please Note:  You might notice some irritation and congestion in your nose or some drainage.  This is from the oxygen used during your procedure.  There is no need for concern and it should clear up in a day or so.  SYMPTOMS TO REPORT IMMEDIATELY:   Following lower endoscopy (colonoscopy or flexible sigmoidoscopy):  Excessive amounts of blood in the stool  Significant tenderness or worsening of abdominal pains  Swelling of the abdomen that is new, acute  Fever of 100F or higher  For urgent or emergent issues, a gastroenterologist can be reached at any hour by calling [phone].   DIET:  We do recommend a small meal at first, but then you may proceed to your regular diet.  Drink plenty of fluids but you should avoid alcoholic beverages for 24 hours.  ACTIVITY:  You should plan to take it easy for the rest of today and you should NOT DRIVE or use heavy machinery until tomorrow (because of the sedation medicines used during the test).     FOLLOW UP: Our staff will call the number listed on your records 48-72 hours following your procedure to check on you and address any questions or concerns that you may have regarding the information given to you following your procedure. If we do not reach you, we will leave a message.  We will attempt to reach you two times.  During this call, we will ask if you have developed any symptoms of COVID 19. If you develop any symptoms (ie: fever, flu-like symptoms, shortness of breath, cough etc.) before then, please call [phone].  If you test positive for Covid 19 in the 2 weeks post procedure, please call and report this information to us.    If any biopsies were taken you will be contacted by phone or by letter within the next 1-3 weeks.  Please call us at [phone] if you have not heard about the biopsies in 3 weeks.    SIGNATURES/CONFIDENTIALITY: You and/or your care partner have signed paperwork which will be entered into your electronic medical record.  These signatures attest to the fact that that the information above on your After Visit Summary has been reviewed and is understood.  Full responsibility of the confidentiality of this discharge information lies with you and/or your care-partner. 

## 2019-02-20 ENCOUNTER — Telehealth: Payer: Self-pay

## 2019-02-20 NOTE — Telephone Encounter (Signed)
  Follow up Call-  Call back number 02/18/2019 12/23/2018  Post procedure Call Back phone  # [phone]  Permission to leave phone message Yes Yes  Some recent data might be hidden     Patient questions:  Do you have a fever, pain , or abdominal swelling? No. Pain Score  0 *  Have you tolerated food without any problems? Yes.    Have you been able to return to your normal activities? Yes.    Do you have any questions about your discharge instructions: Diet   No. Medications  No. Follow up visit  No.  Do you have questions or concerns about your Care? No.  Actions: * If pain score is 4 or above: No action needed, pain <4.  1. Have you developed a fever since your procedure? No  2.   Have you had an respiratory symptoms (SOB or cough) since your procedure? No  3.   Have you tested positive for COVID 19 since your procedure No  4.   Have you had any family members/close contacts diagnosed with the COVID 19 since your procedure?  No   If yes to any of these questions please route to Joylene John, RN and Alphonsa Gin, RN.

## 2019-03-09 ENCOUNTER — Encounter: Payer: Self-pay | Admitting: Family Medicine

## 2019-03-21 DIAGNOSIS — Z20828 Contact with and (suspected) exposure to other viral communicable diseases: Secondary | ICD-10-CM | POA: Diagnosis not present

## 2019-04-25 ENCOUNTER — Other Ambulatory Visit: Payer: Self-pay

## 2019-04-25 MED ORDER — ATORVASTATIN CALCIUM 20 MG PO TABS: 20.0000 mg | ORAL_TABLET | Freq: Every day | ORAL | 1 refills | Status: DC

## 2019-10-28 ENCOUNTER — Other Ambulatory Visit: Payer: Self-pay

## 2019-10-29 ENCOUNTER — Encounter: Payer: Self-pay | Admitting: Family Medicine

## 2019-10-29 ENCOUNTER — Ambulatory Visit (INDEPENDENT_AMBULATORY_CARE_PROVIDER_SITE_OTHER): Payer: BC Managed Care – PPO | Admitting: Family Medicine

## 2019-10-29 VITALS — BP 151/77 | HR 79 | Temp 98.7°F | Resp 16 | Ht 73.0 in | Wt 218.6 lb

## 2019-10-29 DIAGNOSIS — Z87898 Personal history of other specified conditions: Secondary | ICD-10-CM | POA: Diagnosis not present

## 2019-10-29 DIAGNOSIS — Z23 Encounter for immunization: Secondary | ICD-10-CM | POA: Diagnosis not present

## 2019-10-29 DIAGNOSIS — Z125 Encounter for screening for malignant neoplasm of prostate: Secondary | ICD-10-CM

## 2019-10-29 DIAGNOSIS — Z0000 Encounter for general adult medical examination without abnormal findings: Secondary | ICD-10-CM

## 2019-10-29 DIAGNOSIS — E7800 Pure hypercholesterolemia, unspecified: Secondary | ICD-10-CM

## 2019-10-29 DIAGNOSIS — R030 Elevated blood-pressure reading, without diagnosis of hypertension: Secondary | ICD-10-CM | POA: Diagnosis not present

## 2019-10-29 DIAGNOSIS — E669 Obesity, unspecified: Secondary | ICD-10-CM | POA: Insufficient documentation

## 2019-10-29 LAB — COMPREHENSIVE METABOLIC PANEL
ALT: 17 U/L (ref 0–53)
AST: 42 U/L — ABNORMAL HIGH (ref 0–37)
Albumin: 4.5 g/dL (ref 3.5–5.2)
Alkaline Phosphatase: 101 U/L (ref 39–117)
BUN: 9 mg/dL (ref 6–23)
CO2: 29 mEq/L (ref 19–32)
Calcium: 9.5 mg/dL (ref 8.4–10.5)
Chloride: 102 mEq/L (ref 96–112)
Creatinine, Ser: 0.9 mg/dL (ref 0.40–1.50)
GFR: 84.42 mL/min (ref >60.00)
Glucose, Bld: 98 mg/dL (ref 70–99)
Potassium: 4.4 mEq/L (ref 3.5–5.1)
Sodium: 139 mEq/L (ref 135–145)
Total Bilirubin: 0.9 mg/dL (ref 0.2–1.2)
Total Protein: 6.6 g/dL (ref 6.0–8.3)

## 2019-10-29 LAB — LIPID PANEL
Cholesterol: 142 mg/dL (ref 0–200)
HDL: 48.1 mg/dL (ref >39.00)
LDL Cholesterol: 75 mg/dL (ref 0–99)
NonHDL: 93.7
Total CHOL/HDL Ratio: 3
Triglycerides: 92 mg/dL (ref 0.0–149.0)
VLDL: 18.4 mg/dL (ref 0.0–40.0)

## 2019-10-29 LAB — TSH: TSH: 2.11 u[IU]/mL (ref 0.35–4.50)

## 2019-10-29 LAB — PSA, MEDICARE: PSA: 5.04 ng/ml — ABNORMAL HIGH (ref 0.10–4.00)

## 2019-10-29 NOTE — Patient Instructions (Signed)

## 2019-10-29 NOTE — Addendum Note (Signed)
Addended by: Emi Holes D on: 10/29/2019 11:05 AM   Modules accepted: Orders

## 2019-10-29 NOTE — Progress Notes (Signed)
Office Note 10/29/2019  CC:  Chief Complaint  Patient presents with  . Annual Exam    pt is fasting    HPI:  Michael Cox is a 64 y.o.  male who is here for annual health maintenance exam. Hx of white coat HTN--up again here today as per his usual. Home measurements always <130/80.  Walks 2 miles per day on the job. Push mows yard, tree/yard work.  Diet is fair. Got covid vaccine and had much dec PO intake and lost some wt x 5d after initial shot.    Past Medical History:  Diagnosis Date  . Arthritis   . Elevated PSA 07/2018   Repeat 7 mo later->returned to normal range.  . Hyperlipidemia    started statin 07/2018; great response.  . Lower GI bleed    ? due to ASA  . Positive TB test    postive skin test as child; pt was born in the [place], no BCG has been given to him.  Pt reports that he was never required to take any TB treatment.  He says he had multiple x-rays and "they never found anything".  Suspect false positive TB skin test??  No old records available.  Pt is self pay, so the Quant Gold TB test is too cost prohibitive.  No known TB-like illness, no known contacts with TB pt  . Upper GI bleed    due to use of ASA  . White coat syndrome without hypertension     Past Surgical History:  Procedure Laterality Date  . COLONOSCOPY  2006; 12/23/18;02/18/19   12/23/18 colonoscopy aborted due to poor prep and altered anatomy (iliocolonic anastamosis->hx of surgery for gastroschisis). Rpt/double prep-02/2019 still not adequate prep->R col poorly seen->consider virtual clspy. Iliocolonic anast with eryth and inf polyps.  Marland Kitchen GASTROSCHISIS CLOSURE  1955  . HEMANGIOMA EXCISION  2015   from 5th digit right hand  . TONSILLECTOMY  1963    Family History  Problem Relation Age of Onset  . Skin cancer Mother   . Hyperlipidemia Mother   . Heart attack Mother 47  . Lung cancer Father        smoker  . Arthritis Sister   . Alzheimer's disease Maternal Grandmother   . Heart  disease Maternal Grandmother   . Hyperlipidemia Maternal Grandmother   . Hypertension Maternal Grandmother   . Heart attack Maternal Grandmother   . Lung cancer Maternal Grandfather        smoker  . Heart attack Maternal Grandfather   . Stomach cancer Paternal Grandmother   . Heart attack Paternal Grandfather 66  . Colon cancer Neg Hx   . Esophageal cancer Neg Hx   . Rectal cancer Neg Hx   . Colon polyps Neg Hx     Social History   Socioeconomic History  . Marital status: Married    Spouse name: Not on file  . Number of children: Not on file  . Years of education: Not on file  . Highest education level: Not on file  Occupational History  . Not on file  Tobacco Use  . Smoking status: Never Smoker  . Smokeless tobacco: Never Used  Substance and Sexual Activity  . Alcohol use: Yes    Comment: 1-2 glasses of wine daily  . Drug use: Never  . Sexual activity: Not on file  Other Topics Concern  . Not on file  Social History Narrative   Married, 3 sons---[name], [name].   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   No tob/drugs.   Alc: 1 glass wine per evening.   Social Determinants of Health   Financial Resource Strain:   . Difficulty of Paying Living Expenses:   Food Insecurity:   . Worried About Charity fundraiser in the Last Year:   . Arboriculturist in the Last Year:   Transportation Needs:   . Film/video editor (Medical):   Marland Kitchen Lack of Transportation (Non-Medical):   Physical Activity:   . Days of Exercise per Week:   . Minutes of Exercise per Session:   Stress:   . Feeling of Stress :   Social Connections:   . Frequency of Communication with Friends and Family:   . Frequency of Social Gatherings with Friends and Family:   . Attends Religious Services:   . Active Member of Clubs or Organizations:   . Attends Archivist Meetings:   Marland Kitchen Marital Status:   Intimate Partner Violence:   .  Fear of Current or Ex-Partner:   . Emotionally Abused:   Marland Kitchen Physically Abused:   . Sexually Abused:     Outpatient Medications Prior to Visit  Medication Sig Dispense Refill  . atorvastatin (LIPITOR) 20 MG tablet Take 1 tablet (20 mg total) by mouth daily. 90 tablet 1  . Multiple Vitamins-Minerals (CENTRUM ADULTS) TABS Take 1 tablet by mouth daily.    . Omega-3 Fatty Acids (FISH OIL PO) Take 1 capsule by mouth daily. 1200 mg daily    . Multiple Vitamin (MULTIVITAMIN) tablet Take 1 tablet by mouth daily.     Facility-Administered Medications Prior to Visit  Medication Dose Route Frequency Provider Last Rate Last Admin  . 0.9 %  sodium chloride infusion  500 mL Intravenous Once Armbruster, Carlota Raspberry, MD        No Known Allergies  ROS Review of Systems  Constitutional: Negative for appetite change, chills, fatigue and fever.  HENT: Negative for congestion, dental problem, ear pain and sore throat.   Eyes: Negative for discharge, redness and visual disturbance.  Respiratory: Negative for cough, chest tightness, shortness of breath and wheezing.   Cardiovascular: Negative for chest pain, palpitations and leg swelling.  Gastrointestinal: Negative for abdominal pain, blood in stool, diarrhea, nausea and vomiting.  Genitourinary: Negative for difficulty urinating, dysuria, flank pain, frequency, hematuria and urgency.  Musculoskeletal: Negative for arthralgias, back pain, joint swelling, myalgias and neck stiffness.  Skin: Negative for pallor and rash.  Neurological: Negative for dizziness, speech difficulty, weakness and headaches.  Hematological: Negative for adenopathy. Does not bruise/bleed easily.  Psychiatric/Behavioral: Negative for confusion and sleep disturbance. The patient is not nervous/anxious.     PE; Repeat manual bp today: 130/78 Blood pressure (!) 151/77, pulse 79, temperature 98.7 F (37.1 C), temperature source Temporal, resp. rate 16, height 6\' 1"  (1.854 m), weight 218  lb 9.6 oz (99.2 kg), SpO2 98 %. Body mass index is 28.84 kg/m.  Gen: Alert, well appearing.  Patient is oriented to person, place, time, and situation. AFFECT: pleasant, lucid thought and speech. ENT: Ears: EACs clear, normal epithelium.  TMs with good light reflex and landmarks bilaterally.  Eyes: no injection, icteris, swelling, or exudate.  EOMI, PERRLA. Nose: no drainage or turbinate edema/swelling.  No injection or focal lesion.  Mouth: lips without lesion/swelling.  Oral mucosa pink and moist.  Dentition intact and without obvious caries or gingival swelling.  Oropharynx without erythema, exudate, or swelling.  Neck: supple/nontender.  No LAD, mass, or TM.  Carotid pulses 2+ bilaterally, without bruits. CV: RRR, no m/r/g.   LUNGS: CTA bilat, nonlabored resps, good aeration in all lung fields. ABD: soft, NT, ND, BS normal.  No hepatospenomegaly or mass.  No bruits. EXT: no clubbing, cyanosis, or edema.  Musculoskeletal: no joint swelling, erythema, warmth, or tenderness.  ROM of all joints intact. Skin - no sores or suspicious lesions or rashes or color changes   Pertinent labs:  No results found for: TSH Lab Results  Component Value Date   WBC 8.6 07/29/2018   HGB 16.0 07/29/2018   HCT 45.7 07/29/2018   MCV 92.8 07/29/2018   PLT 166.0 07/29/2018   Lab Results  Component Value Date   CREATININE 1.02 07/29/2018   BUN 8 07/29/2018   NA 140 07/29/2018   K 4.6 07/29/2018   CL 103 07/29/2018   CO2 29 07/29/2018   Lab Results  Component Value Date   ALT 14 07/29/2018   AST 34 07/29/2018   ALKPHOS 82 07/29/2018   BILITOT 0.7 07/29/2018   Lab Results  Component Value Date   CHOL 148 02/05/2019   Lab Results  Component Value Date   HDL 48.10 02/05/2019   Lab Results  Component Value Date   LDLCALC 70 02/05/2019   Lab Results  Component Value Date   TRIG 152.0 (H) 02/05/2019   Lab Results  Component Value Date   CHOLHDL 3 02/05/2019   Lab Results  Component  Value Date   PSA 3.51 02/05/2019   PSA 4.14 (H) 07/29/2018    ASSESSMENT AND PLAN:   Health maintenance exam: Reviewed age and gender appropriate health maintenance issues (prudent diet, regular exercise, health risks of tobacco and excessive alcohol, use of seatbelts, fire alarms in home, use of sunscreen).  Also reviewed age and gender appropriate health screening as well as vaccine recommendations. Vaccines: pneumovax 23-> covid 19 mederna x 2.. Otherwise UTD, including shingrix. Labs: fasting HP labs, PSA. Prostate ca screening: hx of elevated PSA.  PSA today. Colon ca screening: 07/2018 no polyps (nearly complete visualization of colon but not quite.  Pt chose not to proceed with virtual colonoscopy)->recall 5 yrs.  An After Visit Summary was printed and given to the patient.  FOLLOW UP:  Return in about 1 year (around 10/28/2020) for annual CPE (fasting).  Signed:  Santiago Bumpers, MD           10/29/2019

## 2019-10-30 ENCOUNTER — Other Ambulatory Visit: Payer: Self-pay | Admitting: Family Medicine

## 2019-10-30 LAB — CBC WITH DIFFERENTIAL/PLATELET
Basophils Absolute: 0 10*3/uL (ref 0.0–0.1)
Basophils Relative: 0.5 % (ref 0.0–3.0)
Eosinophils Absolute: 0.1 10*3/uL (ref 0.0–0.7)
Eosinophils Relative: 0.6 % (ref 0.0–5.0)
HCT: 44.3 % (ref 39.0–52.0)
Hemoglobin: 15.3 g/dL (ref 13.0–17.0)
Lymphocytes Relative: 34.3 % (ref 12.0–46.0)
Lymphs Abs: 2.9 10*3/uL (ref 0.7–4.0)
MCHC: 34.6 g/dL (ref 30.0–36.0)
MCV: 93.8 fl (ref 78.0–100.0)
Monocytes Absolute: 0.4 10*3/uL (ref 0.1–1.0)
Monocytes Relative: 4.1 % (ref 3.0–12.0)
Neutro Abs: 5.2 10*3/uL (ref 1.4–7.7)
Neutrophils Relative %: 60.5 % (ref 43.0–77.0)
Platelets: 157 10*3/uL (ref 150.0–400.0)
RBC: 4.72 Mil/uL (ref 4.22–5.81)
RDW: 14.5 % (ref 11.5–15.5)
WBC: 8.6 10*3/uL (ref 4.0–10.5)

## 2019-10-30 MED ORDER — ATORVASTATIN CALCIUM 20 MG PO TABS: 20.0000 mg | ORAL_TABLET | Freq: Every day | ORAL | 1 refills | Status: DC

## 2020-04-26 NOTE — Progress Notes (Signed)
OFFICE VISIT  04/27/2020  CC: f/u elevated PSA and HLD  HPI:    Patient is a 64 y.o. male who presents for 6 mo f/u hyperlipidemia and elevated PSA. Has hx of white coat syndrome w/out HTN. Last visit was his CPE/labs. My result note on his labs was: "Labs all normal except PSA up a little bit again (5.04 this time, was 3.51 last check 8 mo ago and was 4.14 six mo before that).. Options are repeat in 6 months again OR referral to urologist." He opted to get repeat PSA in 6 mo, which is now.  HLD: tolerating atorvastatin 20mg  qd. Great lipids 61mo ago, hepatic panel normal.  Feeling well. No urinary obstructive sx's. Taking atorva 20mg  qd.  Home bp measurements 140 syst typically, over 70s diast. He admits he doesn't eat low Na diet. Drinks lots of clear fluids.   Past Medical History:  Diagnosis Date  . Arthritis   . Elevated PSA 07/2018   Repeat 7 mo later->returned to normal range.  . Hyperlipidemia    started statin 07/2018; great response.  . Lower GI bleed    ? due to ASA  . Positive TB test    postive skin test as child; pt was born in the 08/2020, no BCG has been given to him.  Pt reports that he was never required to take any TB treatment.  He says he had multiple x-rays and "they never found anything".  Suspect false positive TB skin test??  No old records available.  Pt is self pay, so the Quant Gold TB test is too cost prohibitive.  No known TB-like illness, no known contacts with TB pt  . Upper GI bleed    due to use of ASA  . White coat syndrome without hypertension     Past Surgical History:  Procedure Laterality Date  . COLONOSCOPY  2006; 12/23/18;02/18/19   12/23/18 colonoscopy aborted due to poor prep and altered anatomy (iliocolonic anastamosis->hx of surgery for gastroschisis). Rpt/double prep-02/2019 still not adequate prep->R col poorly seen->consider virtual clspy. Iliocolonic anast with eryth and inf polyps.  12/25/18 GASTROSCHISIS CLOSURE  1955  . HEMANGIOMA  EXCISION  2015   from 5th digit right hand  . TONSILLECTOMY  1963    Outpatient Medications Prior to Visit  Medication Sig Dispense Refill  . atorvastatin (LIPITOR) 20 MG tablet Take 1 tablet (20 mg total) by mouth daily. 90 tablet 1  . Multiple Vitamins-Minerals (CENTRUM ADULTS) TABS Take 1 tablet by mouth daily.    . Omega-3 Fatty Acids (FISH OIL PO) Take 1 capsule by mouth daily. 1200 mg daily     Facility-Administered Medications Prior to Visit  Medication Dose Route Frequency Provider Last Rate Last Admin  . 0.9 %  sodium chloride infusion  500 mL Intravenous Once Armbruster, 03-17-1993, MD        No Known Allergies  ROS As per HPI  PE: Vitals with BMI 04/27/2020 10/29/2019 02/18/2019  Height - 6\' 1"  -  Weight 222 lbs 10 oz 218 lbs 10 oz -  BMI - 28.85 -  Systolic 171 151 10/31/2019  Diastolic 81 77 75  Pulse 69 79 60  Manual bp repeat at end of visit today was 120/82   Gen: Alert, well appearing.  Patient is oriented to person, place, time, and situation. AFFECT: pleasant, lucid thought and speech. CV: RRR, no m/r/g.   LUNGS: CTA bilat, nonlabored resps, good aeration in all lung fields. EXT: no clubbing or cyanosis.  1+ bilat LL pitting edema.    LABS:  Lab Results  Component Value Date   TSH 2.11 10/29/2019   Lab Results  Component Value Date   WBC 8.6 10/29/2019   HGB 15.3 10/29/2019   HCT 44.3 10/29/2019   MCV 93.8 10/29/2019   PLT 157.0 10/29/2019   Lab Results  Component Value Date   CREATININE 0.90 10/29/2019   BUN 9 10/29/2019   NA 139 10/29/2019   K 4.4 10/29/2019   CL 102 10/29/2019   CO2 29 10/29/2019   Lab Results  Component Value Date   ALT 17 10/29/2019   AST 42 (H) 10/29/2019   ALKPHOS 101 10/29/2019   BILITOT 0.9 10/29/2019   Lab Results  Component Value Date   CHOL 142 10/29/2019   Lab Results  Component Value Date   HDL 48.10 10/29/2019   Lab Results  Component Value Date   LDLCALC 75 10/29/2019   Lab Results  Component  Value Date   TRIG 92.0 10/29/2019   Lab Results  Component Value Date   CHOLHDL 3 10/29/2019   Lab Results  Component Value Date   PSA 5.04 (H) 10/29/2019   PSA 3.51 02/05/2019   PSA 4.14 (H) 07/29/2018    IMPRESSION AND PLAN:  1) Elevated PSA, mild. Repeat today and if going up then we'll refer to urology.  2) HLD: tolerating atorva 20mg  qd, lipids and hepatic panel 6 mo ago excellent. Continue this.  3) HTN, home bp's show consistent systolic elevation avg 140 or so. Has white coat component. DASH diet handout reviewed/given to patient. Start hctz 25mg . Instructions: Take 1/2 tab of your new blood pressure medication (HCTZ) once a day. If blood pressure staying consistently over 130 on top after doing this for 5 days then increase to 1 whole tab daily.  An After Visit Summary was printed and given to the patient.  FOLLOW UP: Return in about 2 weeks (around 05/11/2020) for f/u HTN/bmet (non-fasting).  Signed:  , MD           04/27/2020

## 2020-04-27 ENCOUNTER — Other Ambulatory Visit: Payer: Self-pay

## 2020-04-27 ENCOUNTER — Encounter: Payer: Self-pay | Admitting: Family Medicine

## 2020-04-27 ENCOUNTER — Other Ambulatory Visit: Payer: Self-pay | Admitting: Family Medicine

## 2020-04-27 ENCOUNTER — Ambulatory Visit (INDEPENDENT_AMBULATORY_CARE_PROVIDER_SITE_OTHER): Payer: BC Managed Care – PPO | Admitting: Family Medicine

## 2020-04-27 VITALS — BP 120/82 | HR 69 | Temp 98.1°F | Resp 16 | Ht 73.0 in | Wt 222.6 lb

## 2020-04-27 DIAGNOSIS — E7800 Pure hypercholesterolemia, unspecified: Secondary | ICD-10-CM

## 2020-04-27 DIAGNOSIS — I10 Essential (primary) hypertension: Secondary | ICD-10-CM | POA: Diagnosis not present

## 2020-04-27 DIAGNOSIS — R972 Elevated prostate specific antigen [PSA]: Secondary | ICD-10-CM | POA: Diagnosis not present

## 2020-04-27 DIAGNOSIS — Z125 Encounter for screening for malignant neoplasm of prostate: Secondary | ICD-10-CM

## 2020-04-27 DIAGNOSIS — Z23 Encounter for immunization: Secondary | ICD-10-CM | POA: Diagnosis not present

## 2020-04-27 LAB — PSA, MEDICARE: PSA: 3.7 ng/ml (ref 0.10–4.00)

## 2020-04-27 MED ORDER — HYDROCHLOROTHIAZIDE 25 MG PO TABS: 25.0000 mg | ORAL_TABLET | Freq: Every day | ORAL | 1 refills | Status: DC

## 2020-04-27 NOTE — Addendum Note (Signed)
Addended by: Emi Holes D on: 04/27/2020 09:03 AM   Modules accepted: Orders

## 2020-04-27 NOTE — Patient Instructions (Addendum)
Take 1/2 tab of your new blood pressure medication (HCTZ) once a day. If blood pressure staying consistently over 130 on top after doing this for 5 days then increase to 1 whole tab daily.   DASH Eating Plan DASH stands for "Dietary Approaches to Stop Hypertension." The DASH eating plan is a healthy eating plan that has been shown to reduce high blood pressure (hypertension). It may also reduce your risk for type 2 diabetes, heart disease, and stroke. The DASH eating plan may also help with weight loss. What are tips for following this plan?  General guidelines  Avoid eating more than 2,300 mg (milligrams) of salt (sodium) a day. If you have hypertension, you may need to reduce your sodium intake to 1,500 mg a day.  Limit alcohol intake to no more than 1 drink a day for nonpregnant women and 2 drinks a day for men. One drink equals 12 oz of beer, 5 oz of wine, or 1 oz of hard liquor.  Work with your health care provider to maintain a healthy body weight or to lose weight. Ask what an ideal weight is for you.  Get at least 30 minutes of exercise that causes your heart to beat faster (aerobic exercise) most days of the week. Activities may include walking, swimming, or biking.  Work with your health care provider or diet and nutrition specialist (dietitian) to adjust your eating plan to your individual calorie needs. Reading food labels   Check food labels for the amount of sodium per serving. Choose foods with less than 5 percent of the Daily Value of sodium. Generally, foods with less than 300 mg of sodium per serving fit into this eating plan.  To find whole grains, look for the word "whole" as the first word in the ingredient list. Shopping  Buy products labeled as "low-sodium" or "no salt added."  Buy fresh foods. Avoid canned foods and premade or frozen meals. Cooking  Avoid adding salt when cooking. Use salt-free seasonings or herbs instead of table salt or sea salt. Check with  your health care provider or pharmacist before using salt substitutes.  Do not fry foods. Cook foods using healthy methods such as baking, boiling, grilling, and broiling instead.  Cook with heart-healthy oils, such as olive, canola, soybean, or sunflower oil. Meal planning  Eat a balanced diet that includes: ? 5 or more servings of fruits and vegetables each day. At each meal, try to fill half of your plate with fruits and vegetables. ? Up to 6-8 servings of whole grains each day. ? Less than 6 oz of lean meat, poultry, or fish each day. A 3-oz serving of meat is about the same size as a deck of cards. One egg equals 1 oz. ? 2 servings of low-fat dairy each day. ? A serving of nuts, seeds, or beans 5 times each week. ? Heart-healthy fats. Healthy fats called Omega-3 fatty acids are found in foods such as flaxseeds and coldwater fish, like sardines, salmon, and mackerel.  Limit how much you eat of the following: ? Canned or prepackaged foods. ? Food that is high in trans fat, such as fried foods. ? Food that is high in saturated fat, such as fatty meat. ? Sweets, desserts, sugary drinks, and other foods with added sugar. ? Full-fat dairy products.  Do not salt foods before eating.  Try to eat at least 2 vegetarian meals each week.  Eat more home-cooked food and less restaurant, buffet, and fast food.  When eating at a restaurant, ask that your food be prepared with less salt or no salt, if possible. What foods are recommended? The items listed may not be a complete list. Talk with your dietitian about what dietary choices are best for you. Grains Whole-grain or whole-wheat bread. Whole-grain or whole-wheat pasta. Brown rice. [place]. Bulgur. Whole-grain and low-sodium cereals. Pita bread. Low-fat, low-sodium crackers. Whole-wheat flour tortillas. Vegetables Fresh or frozen vegetables (raw, steamed, roasted, or grilled). Low-sodium or reduced-sodium tomato and vegetable  juice. Low-sodium or reduced-sodium tomato sauce and tomato paste. Low-sodium or reduced-sodium canned vegetables. Fruits All fresh, dried, or frozen fruit. Canned fruit in natural juice (without added sugar). Meat and other protein foods Skinless chicken or [place]. Ground chicken or [place]. Pork with fat trimmed off. Fish and seafood. Egg whites. Dried beans, peas, or lentils. Unsalted nuts, nut butters, and seeds. Unsalted canned beans. Lean cuts of beef with fat trimmed off. Low-sodium, lean deli meat. Dairy Low-fat (1%) or fat-free (skim) milk. Fat-free, low-fat, or reduced-fat cheeses. Nonfat, low-sodium ricotta or cottage cheese. Low-fat or nonfat yogurt. Low-fat, low-sodium cheese. Fats and oils Soft margarine without trans fats. Vegetable oil. Low-fat, reduced-fat, or light mayonnaise and salad dressings (reduced-sodium). Canola, safflower, olive, soybean, and sunflower oils. Avocado. Seasoning and other foods Herbs. Spices. Seasoning mixes without salt. Unsalted popcorn and pretzels. Fat-free sweets. What foods are not recommended? The items listed may not be a complete list. Talk with your dietitian about what dietary choices are best for you. Grains Baked goods made with fat, such as croissants, muffins, or some breads. Dry pasta or rice meal packs. Vegetables Creamed or fried vegetables. Vegetables in a cheese sauce. Regular canned vegetables (not low-sodium or reduced-sodium). Regular canned tomato sauce and paste (not low-sodium or reduced-sodium). Regular tomato and vegetable juice (not low-sodium or reduced-sodium). Angie Fava. Olives. Fruits Canned fruit in a light or heavy syrup. Fried fruit. Fruit in cream or butter sauce. Meat and other protein foods Fatty cuts of meat. Ribs. Fried meat. Berniece Salines. Sausage. Bologna and other processed lunch meats. Salami. Fatback. Hotdogs. Bratwurst. Salted nuts and seeds. Canned beans with added salt. Canned or smoked fish. Whole eggs or egg yolks.  Chicken or [place] with skin. Dairy Whole or 2% milk, cream, and half-and-half. Whole or full-fat cream cheese. Whole-fat or sweetened yogurt. Full-fat cheese. Nondairy creamers. Whipped toppings. Processed cheese and cheese spreads. Fats and oils Butter. Stick margarine. Lard. Shortening. Ghee. Bacon fat. Tropical oils, such as coconut, palm kernel, or palm oil. Seasoning and other foods Salted popcorn and pretzels. Onion salt, garlic salt, seasoned salt, table salt, and sea salt. Worcestershire sauce. Tartar sauce. Barbecue sauce. Teriyaki sauce. Soy sauce, including reduced-sodium. Steak sauce. Canned and packaged gravies. Fish sauce. Oyster sauce. Cocktail sauce. Horseradish that you find on the shelf. Ketchup. Mustard. Meat flavorings and tenderizers. Bouillon cubes. Hot sauce and Tabasco sauce. Premade or packaged marinades. Premade or packaged taco seasonings. Relishes. Regular salad dressings. Where to find more information:  National Heart, Lung, and [place]: https://wilson-eaton.com/  American Heart Association: www.heart.org Summary  The DASH eating plan is a healthy eating plan that has been shown to reduce high blood pressure (hypertension). It may also reduce your risk for type 2 diabetes, heart disease, and stroke.  With the DASH eating plan, you should limit salt (sodium) intake to 2,300 mg a day. If you have hypertension, you may need to reduce your sodium intake to 1,500 mg a day.  When on the DASH eating plan,  aim to eat more fresh fruits and vegetables, whole grains, lean proteins, low-fat dairy, and heart-healthy fats.  Work with your health care provider or diet and nutrition specialist (dietitian) to adjust your eating plan to your individual calorie needs. This information is not intended to replace advice given to you by your health care provider. Make sure you discuss any questions you have with your health care provider. Document Revised: 06/08/2017 Document Reviewed:  06/19/2016 Elsevier Patient Education  2020 [employer].

## 2020-04-29 ENCOUNTER — Encounter: Payer: Self-pay | Admitting: Family Medicine

## 2020-05-12 ENCOUNTER — Ambulatory Visit: Payer: BC Managed Care – PPO | Admitting: Family Medicine

## 2020-05-12 ENCOUNTER — Other Ambulatory Visit: Payer: Self-pay

## 2020-05-12 ENCOUNTER — Encounter: Payer: Self-pay | Admitting: Family Medicine

## 2020-05-12 VITALS — BP 154/74 | HR 72 | Temp 98.0°F | Resp 16 | Ht 73.0 in | Wt 224.6 lb

## 2020-05-12 DIAGNOSIS — I10 Essential (primary) hypertension: Secondary | ICD-10-CM | POA: Diagnosis not present

## 2020-05-12 LAB — BASIC METABOLIC PANEL
BUN: 11 mg/dL (ref 6–23)
CO2: 29 mEq/L (ref 19–32)
Calcium: 8.9 mg/dL (ref 8.4–10.5)
Chloride: 100 mEq/L (ref 96–112)
Creatinine, Ser: 0.93 mg/dL (ref 0.40–1.50)
GFR: 85.54 mL/min (ref >60.00)
Glucose, Bld: 110 mg/dL — ABNORMAL HIGH (ref 70–99)
Potassium: 3.8 mEq/L (ref 3.5–5.1)
Sodium: 139 mEq/L (ref 135–145)

## 2020-05-12 NOTE — Progress Notes (Signed)
OFFICE VISIT  05/12/2020  CC:  Chief Complaint  Patient presents with  . Follow-up    HTN, pt is not fasting   HPI:    Patient is a 64 y.o.  male who presents for 2 wk f/u HTN. A/P as of last visit: "HTN, home bp's show consistent systolic elevation avg 140 or so. Has white coat component. DASH diet handout reviewed/given to patient. Start hctz 25mg . Instructions: Take 1/2 tab of your new blood pressure medication (HCTZ) once a day. If blood pressure staying consistently over 130 on top after doing this for 5 days then increase to 1 whole tab daily."  INTERIM HX: Tolerating 1/2 hctz 25mg  qd well. Home bp's (2-3 checks per day) avg 130 over 75. Trying to eat lower Na diet. Active, working full time and feels rested and productive.   Past Medical History:  Diagnosis Date  . Arthritis   . Elevated PSA 07/2018   Hovering a little above and a little below normal 2020-2021; most recent (04/2020) normal.  . Hyperlipidemia    started statin 07/2018; great response.  . Lower GI bleed    ? due to ASA  . Positive TB test    postive skin test as child; pt was born in the 05/2022, no BCG has been given to him.  Pt reports that he was never required to take any TB treatment.  He says he had multiple x-rays and "they never found anything".  Suspect false positive TB skin test??  No old records available.  Pt is self pay, so the Quant Gold TB test is too cost prohibitive.  No known TB-like illness, no known contacts with TB pt  . Upper GI bleed    due to use of ASA  . White coat syndrome without hypertension     Past Surgical History:  Procedure Laterality Date  . COLONOSCOPY  2006; 12/23/18;02/18/19   12/23/18 colonoscopy aborted due to poor prep and altered anatomy (iliocolonic anastamosis->hx of surgery for gastroschisis). Rpt/double prep-02/2019 still not adequate prep->R col poorly seen->consider virtual clspy. Iliocolonic anast with eryth and inf polyps.  12/25/18 GASTROSCHISIS CLOSURE  1955  .  HEMANGIOMA EXCISION  2015   from 5th digit right hand  . TONSILLECTOMY  1963    Outpatient Medications Prior to Visit  Medication Sig Dispense Refill  . atorvastatin (LIPITOR) 20 MG tablet TAKE 1 TABLET(20 MG) BY MOUTH DAILY 90 tablet 1  . hydrochlorothiazide (HYDRODIURIL) 25 MG tablet Take 1 tablet (25 mg total) by mouth daily. 30 tablet 1  . Multiple Vitamins-Minerals (CENTRUM ADULTS) TABS Take 1 tablet by mouth daily.    . Omega-3 Fatty Acids (FISH OIL PO) Take 1 capsule by mouth daily. 1200 mg daily     Facility-Administered Medications Prior to Visit  Medication Dose Route Frequency Provider Last Rate Last Admin  . 0.9 %  sodium chloride infusion  500 mL Intravenous Once Armbruster, 03-17-1993, MD        No Known Allergies  ROS As per HPI  PE: Vitals with BMI 05/12/2020 04/27/2020 04/27/2020  Height 6\' 1"  - 6\' 1"   Weight 224 lbs 10 oz - 222 lbs 10 oz  BMI 29.64 - 29.37  Systolic 154 120 04/29/2020  Diastolic 74 82 81  Pulse 72 - 69    Gen: Alert, well appearing.  Patient is oriented to person, place, time, and situation. AFFECT: pleasant, lucid thought and speech. CV: RRR, no m/r/g.   LUNGS: CTA bilat, nonlabored resps, good aeration  in all lung fields. EXT: no clubbing or cyanosis.  Trace bilat LL pitting edema.    LABS:    Chemistry      Component Value Date/Time   NA 139 10/29/2019 1054   K 4.4 10/29/2019 1054   CL 102 10/29/2019 1054   CO2 29 10/29/2019 1054   BUN 9 10/29/2019 1054   CREATININE 0.90 10/29/2019 1054      Component Value Date/Time   CALCIUM 9.5 10/29/2019 1054   ALKPHOS 101 10/29/2019 1054   AST 42 (H) 10/29/2019 1054   ALT 17 10/29/2019 1054   BILITOT 0.9 10/29/2019 1054      IMPRESSION AND PLAN:  HTN: well controlled/great response on hctz 12.5mg  qd. No changes.  Can check bp/hr at home 2-3/d per week now. Discussed goal <130/80 consistently, call if persistently higher before next f/u visit. bmet today.  An After Visit Summary was  printed and given to the patient.  FOLLOW UP: Return in about 6 months (around 11/09/2020) for annual CPE (fasting).  Signed:  Santiago Bumpers, MD           05/12/2020

## 2020-06-24 ENCOUNTER — Other Ambulatory Visit: Payer: Self-pay | Admitting: Family Medicine

## 2020-08-25 ENCOUNTER — Other Ambulatory Visit: Payer: Self-pay | Admitting: Family Medicine

## 2020-10-24 ENCOUNTER — Other Ambulatory Visit: Payer: Self-pay | Admitting: Family Medicine

## 2020-11-10 ENCOUNTER — Encounter: Payer: Self-pay | Admitting: Family Medicine

## 2020-11-10 ENCOUNTER — Encounter: Payer: BLUE CROSS/BLUE SHIELD | Admitting: Family Medicine

## 2020-11-10 NOTE — Progress Notes (Deleted)
Office Note 11/10/2020  CC: No chief complaint on file.   HPI:  Michael Cox is a 65 y.o. male who is here for annual health maintenance exam and 6 mo f/u HTN, HLD, hx of elevated PSA. A/P as of last visit: "1) Elevated PSA, mild. Repeat today and if going up then we'll refer to urology.  2) HLD: tolerating atorva 20mg  qd, lipids and hepatic panel 6 mo ago excellent. Continue this.  3) HTN, home bp's show consistent systolic elevation avg 140 or so. Has white coat component. DASH diet handout reviewed/given to patient. Start hctz 25mg . Instructions: Take 1/2 tab of your new blood pressure medication (HCTZ) once a day. If blood pressure staying consistently over 130 on top after doing this for 5 days then increase to 1 whole tab daily"  INTERIM HX: ***  HTN: last visit had been responding well to 12.5mg  hctz qd.  Lytes/cr normal.  HLD:  Hx elevated PSA: the 6 mo rpt last visit was down 5.04 to 3.7.   Past Medical History:  Diagnosis Date  . Arthritis   . Elevated PSA 07/2018   Hovering a little above and a little below normal 2020-2021; most recent (04/2020) normal.  . Hyperlipidemia    started statin 07/2018; great response.  . Lower GI bleed    ? due to ASA  . Positive TB test    postive skin test as child; pt was born in the 05/2022, no BCG has been given to him.  Pt reports that he was never required to take any TB treatment.  He says he had multiple x-rays and "they never found anything".  Suspect false positive TB skin test??  No old records available.  Pt is self pay, so the Quant Gold TB test is too cost prohibitive.  No known TB-like illness, no known contacts with TB pt  . Upper GI bleed    due to use of ASA  . White coat syndrome without hypertension     Past Surgical History:  Procedure Laterality Date  . COLONOSCOPY  2006; 12/23/18;02/18/19   12/23/18 colonoscopy aborted due to poor prep and altered anatomy (iliocolonic anastamosis->hx of surgery for  gastroschisis). Rpt/double prep-02/2019 still not adequate prep->R col poorly seen->consider virtual clspy. Iliocolonic anast with eryth and inf polyps.  12/25/18 GASTROSCHISIS CLOSURE  1955  . HEMANGIOMA EXCISION  2015   from 5th digit right hand  . TONSILLECTOMY  1963    Family History  Problem Relation Age of Onset  . Skin cancer Mother   . Hyperlipidemia Mother   . Heart attack Mother 57  . Lung cancer Father        smoker  . Arthritis Sister   . Alzheimer's disease Maternal Grandmother   . Heart disease Maternal Grandmother   . Hyperlipidemia Maternal Grandmother   . Hypertension Maternal Grandmother   . Heart attack Maternal Grandmother   . Lung cancer Maternal Grandfather        smoker  . Heart attack Maternal Grandfather   . Stomach cancer Paternal Grandmother   . Heart attack Paternal Grandfather 16  . Colon cancer Neg Hx   . Esophageal cancer Neg Hx   . Rectal cancer Neg Hx   . Colon polyps Neg Hx     Social History   Socioeconomic History  . Marital status: Married    Spouse name: Not on file  . Number of children: Not on file  . Years of education: Not on file  .  Highest education level: Not on file  Occupational History  . Not on file  Tobacco Use  . Smoking status: Never Smoker  . Smokeless tobacco: Never Used  Vaping Use  . Vaping Use: Never used  Substance and Sexual Activity  . Alcohol use: Yes    Comment: 1-2 glasses of wine daily  . Drug use: Never  . Sexual activity: Not on file  Other Topics Concern  . Not on file  Social History Narrative   Married, 3 sons---[name], [name].   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   No tob/drugs.   Alc: 1 glass wine per evening.   Social Determinants of Health   Financial Resource Strain: Not on file  Food Insecurity: Not on file  Transportation Needs: Not on file  Physical Activity: Not on file  Stress: Not on file  Social Connections: Not  on file  Intimate Partner Violence: Not on file    Outpatient Medications Prior to Visit  Medication Sig Dispense Refill  . atorvastatin (LIPITOR) 20 MG tablet TAKE 1 TABLET(20 MG) BY MOUTH DAILY 30 tablet 0  . hydrochlorothiazide (HYDRODIURIL) 25 MG tablet TAKE 1 TABLET(25 MG) BY MOUTH DAILY 90 tablet 0  . Multiple Vitamins-Minerals (CENTRUM ADULTS) TABS Take 1 tablet by mouth daily.    . Omega-3 Fatty Acids (FISH OIL PO) Take 1 capsule by mouth daily. 1200 mg daily     Facility-Administered Medications Prior to Visit  Medication Dose Route Frequency Provider Last Rate Last Admin  . 0.9 %  sodium chloride infusion  500 mL Intravenous Once Benancio Deeds, MD        No Known Allergies  ROS *** PE; Vitals with BMI 05/12/2020 04/27/2020 04/27/2020  Height 6\' 1"  - 6\' 1"   Weight 224 lbs 10 oz - 222 lbs 10 oz  BMI 29.64 - 29.37  Systolic 154 120  Diastolic 74 82 81  Pulse 72 - 69     *** Pertinent labs:  Lab Results  Component Value Date   TSH 2.11 10/29/2019   Lab Results  Component Value Date   WBC 8.6 10/29/2019   HGB 15.3 10/29/2019   HCT 44.3 10/29/2019   MCV 93.8 10/29/2019   PLT 157.0 10/29/2019   Lab Results  Component Value Date   CREATININE 0.93 05/12/2020   BUN 11 05/12/2020   NA 139 05/12/2020   K 3.8 05/12/2020   CL 100 05/12/2020   CO2 29 05/12/2020   Lab Results  Component Value Date   ALT 17 10/29/2019   AST 42 (H) 10/29/2019   ALKPHOS 101 10/29/2019   BILITOT 0.9 10/29/2019   Lab Results  Component Value Date   CHOL 142 10/29/2019   Lab Results  Component Value Date   HDL 48.10 10/29/2019   Lab Results  Component Value Date   LDLCALC 75 10/29/2019   Lab Results  Component Value Date   TRIG 92.0 10/29/2019   Lab Results  Component Value Date   CHOLHDL 3 10/29/2019   Lab Results  Component Value Date   PSA 3.70 04/27/2020   PSA 5.04 (H) 10/29/2019   PSA 3.51 02/05/2019    ASSESSMENT AND PLAN:   1)  HTN Lytes/cr today.  2) HLD: tolerating atorva 20mg  qd. FLP and hepatic panel today.  3) Health maintenance exam: Reviewed age and gender appropriate health maintenance issues (prudent diet, regular exercise, health risks of tobacco and excessive alcohol, use of seatbelts,  fire alarms in home, use of sunscreen).  Also reviewed age and gender appropriate health screening as well as vaccine recommendations. Vaccines: Prevnar 20 recommended->***.  Otherwise ALL UTD. Labs: cbc, cmet, flp, psa. Prostate ca screening: hx of mild elev PSA->PSA check today. Colon ca screening: as per Dr. Adela Lank with GI->consider virtual colonoscopy since technically difficult colonoscopy 2020 d/t iliocolonic anastamosis and poor prep.  An After Visit Summary was printed and given to the patient.  FOLLOW UP:  No follow-ups on file.  Signed:  Santiago Bumpers, MD           11/10/2020

## 2020-11-25 ENCOUNTER — Other Ambulatory Visit: Payer: Self-pay | Admitting: Family Medicine

## 2021-01-13 ENCOUNTER — Other Ambulatory Visit: Payer: Self-pay

## 2021-01-13 MED ORDER — ATORVASTATIN CALCIUM 20 MG PO TABS: ORAL_TABLET | ORAL | 0 refills | Status: DC

## 2021-01-17 ENCOUNTER — Encounter: Payer: Self-pay | Admitting: Family Medicine

## 2021-01-20 ENCOUNTER — Other Ambulatory Visit: Payer: Self-pay | Admitting: Family Medicine

## 2021-01-21 ENCOUNTER — Encounter: Payer: Self-pay | Admitting: Family Medicine

## 2021-01-21 ENCOUNTER — Telehealth: Payer: Self-pay

## 2021-01-21 ENCOUNTER — Other Ambulatory Visit: Payer: Self-pay

## 2021-01-21 ENCOUNTER — Ambulatory Visit (INDEPENDENT_AMBULATORY_CARE_PROVIDER_SITE_OTHER): Payer: Medicare Other | Admitting: Family Medicine

## 2021-01-21 VITALS — BP 129/73 | HR 92 | Temp 97.4°F | Resp 16 | Ht 73.0 in | Wt 211.8 lb

## 2021-01-21 DIAGNOSIS — E7800 Pure hypercholesterolemia, unspecified: Secondary | ICD-10-CM

## 2021-01-21 DIAGNOSIS — Z23 Encounter for immunization: Secondary | ICD-10-CM

## 2021-01-21 DIAGNOSIS — Z125 Encounter for screening for malignant neoplasm of prostate: Secondary | ICD-10-CM | POA: Diagnosis not present

## 2021-01-21 DIAGNOSIS — I10 Essential (primary) hypertension: Secondary | ICD-10-CM

## 2021-01-21 DIAGNOSIS — Z0000 Encounter for general adult medical examination without abnormal findings: Secondary | ICD-10-CM

## 2021-01-21 LAB — LIPID PANEL
Cholesterol: 143 mg/dL (ref 0–200)
HDL: 45.1 mg/dL (ref >39.00)
LDL Cholesterol: 76 mg/dL (ref 0–99)
NonHDL: 97.68
Total CHOL/HDL Ratio: 3
Triglycerides: 107 mg/dL (ref 0.0–149.0)
VLDL: 21.4 mg/dL (ref 0.0–40.0)

## 2021-01-21 LAB — COMPREHENSIVE METABOLIC PANEL
ALT: 11 U/L (ref 0–53)
AST: 37 U/L (ref 0–37)
Albumin: 4.5 g/dL (ref 3.5–5.2)
Alkaline Phosphatase: 102 U/L (ref 39–117)
BUN: 13 mg/dL (ref 6–23)
CO2: 31 mEq/L (ref 19–32)
Calcium: 8.9 mg/dL (ref 8.4–10.5)
Chloride: 102 mEq/L (ref 96–112)
Creatinine, Ser: 0.97 mg/dL (ref 0.40–1.50)
GFR: 80.93 mL/min (ref >60.00)
Glucose, Bld: 99 mg/dL (ref 70–99)
Potassium: 4.4 mEq/L (ref 3.5–5.1)
Sodium: 142 mEq/L (ref 135–145)
Total Bilirubin: 0.8 mg/dL (ref 0.2–1.2)
Total Protein: 6.5 g/dL (ref 6.0–8.3)

## 2021-01-21 LAB — CBC WITH DIFFERENTIAL/PLATELET
Basophils Absolute: 0 10*3/uL (ref 0.0–0.1)
Basophils Relative: 0.3 % (ref 0.0–3.0)
Eosinophils Absolute: 0 10*3/uL (ref 0.0–0.7)
Eosinophils Relative: 0.4 % (ref 0.0–5.0)
HCT: 43.9 % (ref 39.0–52.0)
Hemoglobin: 15.4 g/dL (ref 13.0–17.0)
Lymphocytes Relative: 41.6 % (ref 12.0–46.0)
Lymphs Abs: 3.9 10*3/uL (ref 0.7–4.0)
MCHC: 35 g/dL (ref 30.0–36.0)
MCV: 92.5 fl (ref 78.0–100.0)
Monocytes Absolute: 0.4 10*3/uL (ref 0.1–1.0)
Monocytes Relative: 4.6 % (ref 3.0–12.0)
Neutro Abs: 5 10*3/uL (ref 1.4–7.7)
Neutrophils Relative %: 53.1 % (ref 43.0–77.0)
Platelets: 169 10*3/uL (ref 150.0–400.0)
RBC: 4.75 Mil/uL (ref 4.22–5.81)
RDW: 12.6 % (ref 11.5–15.5)
WBC: 9.5 10*3/uL (ref 4.0–10.5)

## 2021-01-21 LAB — PSA, MEDICARE: PSA: 5.22 ng/ml — ABNORMAL HIGH (ref 0.10–4.00)

## 2021-01-21 MED ORDER — ATORVASTATIN CALCIUM 20 MG PO TABS: ORAL_TABLET | ORAL | 3 refills | Status: DC

## 2021-01-21 MED ORDER — HYDROCHLOROTHIAZIDE 25 MG PO TABS: ORAL_TABLET | ORAL | 3 refills | Status: DC

## 2021-01-21 NOTE — Telephone Encounter (Signed)
Pt advised to continue taking 1/2 tab of hctz as previously directed.

## 2021-01-21 NOTE — Telephone Encounter (Signed)
Yes keep taking.

## 2021-01-21 NOTE — Telephone Encounter (Signed)
During pt's last ov 05/12/20, advised to continue hctz 12.5mg  qd. Pt would like to know if he should continue doing this.  Please advise, thanks.

## 2021-01-21 NOTE — Progress Notes (Signed)
Office Note 01/21/2021  CC:  Chief Complaint  Patient presents with   Follow-up    RCI, pt is fasting    HPI:  Michael Cox is a 65 y.o. male who is here for f/u HTN and HLD and discussion of preventative health care. A/P as of last visit 8 mo ago: "HTN: well controlled/great response on hctz 12.5mg  qd. No changes.  Can check bp/hr at home 2-3/d per week now. Discussed goal <130/80 consistently, call if persistently higher before next f/u visit. bmet today."  INTERIM HX: Feeling well, working full time still and enjoying it. Improving diet (much less bread).  Lots of activity but no formal regimen.  Has a beach house.  BP at home usually 125/75, nothing elevated or low. Taking hctz 25 qd.  CHOL: atorva 20 qd tolerated well.  Past Medical History:  Diagnosis Date   Arthritis    Elevated PSA 07/2018   Hovering a little above and a little below normal 2020-2021; most recent (04/2020) normal.   Hyperlipidemia    started statin 07/2018; great response.   Lower GI bleed    ? due to ASA   Positive TB test    postive skin test as child; pt was born in the [place], no BCG has been given to him.  Pt reports that he was never required to take any TB treatment.  He says he had multiple x-rays and "they never found anything".  Suspect false positive TB skin test??  No old records available.  Pt is self pay, so the Quant Gold TB test is too cost prohibitive.  No known TB-like illness, no known contacts with TB pt   Upper GI bleed    due to use of ASA   White coat syndrome with hypertension     Past Surgical History:  Procedure Laterality Date   COLONOSCOPY  2006; 12/23/18;02/18/19   12/23/18 colonoscopy aborted due to poor prep and altered anatomy (iliocolonic anastamosis->hx of surgery for gastroschisis). Rpt/double prep-02/2019 still not adequate prep->R col poorly seen->consider virtual clspy. Iliocolonic anast with eryth and inf polyps.   GASTROSCHISIS CLOSURE  1955   HEMANGIOMA  EXCISION  2015   from 5th digit right hand   TONSILLECTOMY  1963    Family History  Problem Relation Age of Onset   Skin cancer Mother    Hyperlipidemia Mother    Heart attack Mother 5   Lung cancer Father        smoker   Arthritis Sister    Alzheimer's disease Maternal Grandmother    Heart disease Maternal Grandmother    Hyperlipidemia Maternal Grandmother    Hypertension Maternal Grandmother    Heart attack Maternal Grandmother    Lung cancer Maternal Grandfather        smoker   Heart attack Maternal Grandfather    Stomach cancer Paternal Grandmother    Heart attack Paternal Grandfather 72   Colon cancer Neg Hx    Esophageal cancer Neg Hx    Rectal cancer Neg Hx    Colon polyps Neg Hx     Social History   Socioeconomic History   Marital status: Married    Spouse name: Not on file   Number of children: Not on file   Years of education: Not on file   Highest education level: Not on file  Occupational History   Not on file  Tobacco Use   Smoking status: Never   Smokeless tobacco: Never  Vaping Use   Vaping Use:  Never used  Substance and Sexual Activity   Alcohol use: Yes    Comment: 1-2 glasses of wine daily   Drug use: Never   Sexual activity: Not on file  Other Topics Concern   Not on file  Social History Narrative   Married, 3 sons---[name], [name].   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   No tob/drugs.   Alc: 1 glass wine per evening.   Social Determinants of Health   Financial Resource Strain: Not on file  Food Insecurity: Not on file  Transportation Needs: Not on file  Physical Activity: Not on file  Stress: Not on file  Social Connections: Not on file  Intimate Partner Violence: Not on file    Outpatient Medications Prior to Visit  Medication Sig Dispense Refill   Multiple Vitamins-Minerals (CENTRUM ADULTS) TABS Take 1 tablet by mouth daily.     Omega-3 Fatty Acids (FISH OIL  PO) Take 1 capsule by mouth daily. 1200 mg daily     atorvastatin (LIPITOR) 20 MG tablet TAKE 1 TABLET(20 MG) BY MOUTH DAILY 10 tablet 0   hydrochlorothiazide (HYDRODIURIL) 25 MG tablet TAKE 1 TABLET(25 MG) BY MOUTH DAILY 30 tablet 0   Facility-Administered Medications Prior to Visit  Medication Dose Route Frequency Provider Last Rate Last Admin   0.9 %  sodium chloride infusion  500 mL Intravenous Once Armbruster, Willaim Rayas, MD        No Known Allergies  ROS Review of Systems  Constitutional:  Negative for appetite change, chills, fatigue and fever.  HENT:  Negative for congestion, dental problem, ear pain and sore throat.   Eyes:  Negative for discharge, redness and visual disturbance.  Respiratory:  Negative for cough, chest tightness, shortness of breath and wheezing.   Cardiovascular:  Negative for chest pain, palpitations and leg swelling.  Gastrointestinal:  Negative for abdominal pain, blood in stool, diarrhea, nausea and vomiting.  Genitourinary:  Negative for difficulty urinating, dysuria, flank pain, frequency, hematuria and urgency.  Musculoskeletal:  Negative for arthralgias, back pain, joint swelling, myalgias and neck stiffness.  Skin:  Negative for pallor and rash.  Neurological:  Negative for dizziness, speech difficulty, weakness and headaches.  Hematological:  Negative for adenopathy. Does not bruise/bleed easily.  Psychiatric/Behavioral:  Negative for confusion and sleep disturbance. The patient is not nervous/anxious.    PE; Vitals with BMI 01/21/2021 05/12/2020 04/27/2020  Height 6\' 1"  6\' 1"  -  Weight 211 lbs 13 oz 224 lbs 10 oz -  BMI 27.95 29.64 -  Systolic 129 154  Diastolic 73 74 82  Pulse 92 72 -   Gen: Alert, well appearing.  Patient is oriented to person, place, time, and situation. AFFECT: pleasant, lucid thought and speech. ENT: Ears: EACs clear, normal epithelium.  TMs with good light reflex and landmarks bilaterally.  Eyes: no injection, icteris,  swelling, or exudate.  EOMI, PERRLA. Nose: no drainage or turbinate edema/swelling.  No injection or focal lesion.  Mouth: lips without lesion/swelling.  Oral mucosa pink and moist.  Dentition intact and without obvious caries or gingival swelling.  Oropharynx without erythema, exudate, or swelling.  Neck: supple/nontender.  No LAD, mass, or TM.  Carotid pulses 2+ bilaterally, without bruits. CV: RRR, no m/r/g.   LUNGS: CTA bilat, nonlabored resps, good aeration in all lung fields. ABD: soft, NT, ND, BS normal.  No hepatospenomegaly or mass.  No bruits. EXT: no clubbing or cyanosis.  He has mild bilat  LL pretibial hyperpigmentation and 2+ pitting edema RLL, trace to 1+ pitting L LL.  No erythema. Musculoskeletal: no joint swelling, erythema, warmth, or tenderness.  ROM of all joints intact. Skin - no sores or suspicious lesions or rashes or color changes  Pertinent labs:  Lab Results  Component Value Date   TSH 2.11 10/29/2019   Lab Results  Component Value Date   WBC 8.6 10/29/2019   HGB 15.3 10/29/2019   HCT 44.3 10/29/2019   MCV 93.8 10/29/2019   PLT 157.0 10/29/2019   Lab Results  Component Value Date   CREATININE 0.93 05/12/2020   BUN 11 05/12/2020   NA 139 05/12/2020   K 3.8 05/12/2020   CL 100 05/12/2020   CO2 29 05/12/2020   Lab Results  Component Value Date   ALT 17 10/29/2019   AST 42 (H) 10/29/2019   ALKPHOS 101 10/29/2019   BILITOT 0.9 10/29/2019   Lab Results  Component Value Date   CHOL 142 10/29/2019   Lab Results  Component Value Date   HDL 48.10 10/29/2019   Lab Results  Component Value Date   LDLCALC 75 10/29/2019   Lab Results  Component Value Date   TRIG 92.0 10/29/2019   Lab Results  Component Value Date   CHOLHDL 3 10/29/2019   Lab Results  Component Value Date   PSA 3.70 04/27/2020   PSA 5.04 (H) 10/29/2019   PSA 3.51 02/05/2019   ASSESSMENT AND PLAN:   1) HTN: well controlled with hctz 25 qd. Lytes/cr today.  2) HLD:  tolerating atorva 20 qd. Goal LDL <100. FLP and hepatic panel today.  3) Preventative health care: Vaccines: prevnar 20 due->given today. Otherwise UTD, including shingrix. Labs: fasting HP labs, PSA.  He declines hep c screening. Prostate ca screening: hx of mildly elevated PSA, returned to normal 9 mo ago.  PSA today. Colon ca screening: 07/2018 no polyps (nearly complete visualization of colon but not quite.  Pt chose not to proceed with virtual colonoscopy)->recall 5 yrs.  An After Visit Summary was printed and given to the patient.  FOLLOW UP:  Return in about 6 months (around 07/24/2021) for routine chronic illness f/u.  Signed:  Santiago Bumpers, MD           01/21/2021

## 2021-01-24 ENCOUNTER — Telehealth: Payer: Self-pay

## 2021-01-24 ENCOUNTER — Encounter: Payer: Self-pay | Admitting: Family Medicine

## 2021-01-24 ENCOUNTER — Other Ambulatory Visit: Payer: Medicare Other

## 2021-01-24 DIAGNOSIS — R7989 Other specified abnormal findings of blood chemistry: Secondary | ICD-10-CM

## 2021-01-24 NOTE — Telephone Encounter (Addendum)
Spoke with Felicia to inform add on labs no longer needed

## 2021-01-24 NOTE — Telephone Encounter (Signed)
Spoke with Michael Cox and she was unable to add labs because it was asking about waiver form for separate billing.  Felicia from lab calling about add on.  Britt requested.   Please call Felicia at [phone]

## 2021-01-24 NOTE — Telephone Encounter (Signed)
It's ok to not add the lab on.-thx

## 2021-09-20 ENCOUNTER — Other Ambulatory Visit: Payer: Self-pay

## 2021-09-20 ENCOUNTER — Ambulatory Visit (INDEPENDENT_AMBULATORY_CARE_PROVIDER_SITE_OTHER): Payer: Medicare Other

## 2021-09-20 DIAGNOSIS — Z0000 Encounter for general adult medical examination without abnormal findings: Secondary | ICD-10-CM

## 2021-09-20 NOTE — Patient Instructions (Signed)

## 2021-09-20 NOTE — Progress Notes (Signed)
? ?Subjective:  ? Michael Cox is Cox 66 y.o. male who presents for Medicare Annual/Subsequent preventive examination. ? ?Review of Systems    ?I connected with  Michael Cox on 09/20/21 by an audio only telemedicine application and verified that I am speaking with the correct person using two identifiers. ?  ?I discussed the limitations, risks, security and privacy concerns of performing an evaluation and management service by telephone and the availability of in person appointments. I also discussed with the patient that there may be Cox patient responsible charge related to this service. The patient expressed understanding and verbally consented to this telephonic visit. ? ?Location of Patient: home ?Location of Provider: office ? ?List any persons and their role that are participating in the visit with the patient.  ? ?Kara MeadBruce Teters ?Harlene SaltsVanessa Kanna Dafoe ? ?   ?Objective:  ?  ?There were no vitals filed for this visit. ?There is no height or weight on file to calculate BMI. ? ?No flowsheet data found. ? ?Current Medications (verified) ?Outpatient Encounter Medications as of 09/20/2021  ?Medication Sig  ? atorvastatin (LIPITOR) 20 MG tablet TAKE 1 TABLET(20 MG) BY MOUTH DAILY  ? hydrochlorothiazide (HYDRODIURIL) 25 MG tablet TAKE 1 TABLET(25 MG) BY MOUTH DAILY  ? Multiple Vitamins-Minerals (CENTRUM ADULTS) TABS Take 1 tablet by mouth daily.  ? Omega-3 Fatty Acids (FISH OIL PO) Take 1 capsule by mouth daily. 1200 mg daily  ? ?Facility-Administered Encounter Medications as of 09/20/2021  ?Medication  ? 0.9 %  sodium chloride infusion  ? ? ?Allergies (verified) ?Patient has no known allergies.  ? ?History: ?Past Medical History:  ?Diagnosis Date  ? Arthritis   ? Elevated PSA 07/2018  ? Hovering Cox little above and Cox little below normal 2020-2021; most recent (04/2020) normal.  ? History of GI bleed   ? upper and lower->? d/t ASA  ? Hyperlipidemia   ? started statin 07/2018; great response.  ? Positive TB test   ? postive skin  test as child; pt was born in the [place], no BCG has been given to him.  Pt reports that he was never required to take any TB treatment.  He says he had multiple x-rays and "they never found anything".  Suspect false positive TB skin test??  No old records available.  Pt is self pay, so the Quant Gold TB test is too cost prohibitive.  No known TB-like illness, no known contacts with TB pt  ? Yarelly Kuba coat syndrome with hypertension   ? ?Past Surgical History:  ?Procedure Laterality Date  ? COLONOSCOPY  2006; 12/23/18;02/18/19  ? 12/23/18 colonoscopy aborted due to poor prep and altered anatomy (iliocolonic anastamosis->hx of surgery for gastroschisis). Rpt/double prep-02/2019 still not adequate prep->R col poorly seen->consider virtual clspy. Iliocolonic anast with eryth and inf polyps.  ? GASTROSCHISIS CLOSURE  1955  ? HEMANGIOMA EXCISION  2015  ? from 5th digit right hand  ? TONSILLECTOMY  1963  ? ?Family History  ?Problem Relation Age of Onset  ? Skin cancer Mother   ? Hyperlipidemia Mother   ? Heart attack Mother 2080  ? Lung cancer Father   ?     smoker  ? Arthritis Sister   ? Alzheimer's disease Maternal Grandmother   ? Heart disease Maternal Grandmother   ? Hyperlipidemia Maternal Grandmother   ? Hypertension Maternal Grandmother   ? Heart attack Maternal Grandmother   ? Lung cancer Maternal Grandfather   ?     smoker  ? Heart attack  Maternal Grandfather   ? Stomach cancer Paternal Grandmother   ? Heart attack Paternal Grandfather 49  ? Colon cancer Neg Hx   ? Esophageal cancer Neg Hx   ? Rectal cancer Neg Hx   ? Colon polyps Neg Hx   ? ?Social History  ? ?Socioeconomic History  ? Marital status: Married  ?  Spouse name: Not on file  ? Number of children: Not on file  ? Years of education: Not on file  ? Highest education level: Not on file  ?Occupational History  ? Not on file  ?Tobacco Use  ? Smoking status: Never  ? Smokeless tobacco: Never  ?Vaping Use  ? Vaping Use: Never used  ?Substance and Sexual Activity  ?  Alcohol use: Yes  ?  Comment: 1-2 glasses of wine daily  ? Drug use: Never  ? Sexual activity: Not on file  ?Other Topics Concern  ? Not on file  ?Social History Narrative  ? Married, 3 sons---[name], [name].  ? Educ: Associates Degree  ? 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  ? No tob/drugs.  ? Alc: 1 glass wine per evening.  ? ?Social Determinants of Health  ? ?Financial Resource Strain: Low Risk   ? Difficulty of Paying Living Expenses: Not hard at all  ?Food Insecurity: No Food Insecurity  ? Worried About Programme researcher, broadcasting/film/video in the Last Year: Never true  ? Ran Out of Food in the Last Year: Never true  ?Transportation Needs: No Transportation Needs  ? Lack of Transportation (Medical): No  ? Lack of Transportation (Non-Medical): No  ?Physical Activity: Not on file  ?Stress: Not on file  ?Social Connections: Socially Integrated  ? Frequency of Communication with Friends and Family: More than three times Cox week  ? Frequency of Social Gatherings with Friends and Family: More than three times Cox week  ? Attends Religious Services: More than 4 times per year  ? Active Member of Clubs or Organizations: Yes  ? Attends Banker Meetings: More than 4 times per year  ? Marital Status: Married  ? ? ?Tobacco Counseling ?Counseling given: Not Answered ? ? ?Clinical Intake: ? ?Pre-visit preparation completed: Yes ? ?  ? ?  ? ?Nutritional Risks: None ?Diabetes: No ? ?How often do you need to have someone help you when you read instructions, pamphlets, or other written materials from your doctor or pharmacy?: 1 - Never ? ?Diabetic?no ? ?Interpreter Needed?: No ? ?  ? ? ?Activities of Daily Living ?In your present state of health, do you have any difficulty performing the following activities: 09/20/2021  ?Hearing? N  ?Vision? N  ?Difficulty concentrating or making decisions? N  ?Walking or climbing stairs? N  ?Dressing or bathing? N  ?Doing errands, shopping? N  ?Preparing Food and  eating ? N  ?Using the Toilet? N  ?In the past six months, have you accidently leaked urine? N  ?Do you have problems with loss of bowel control? N  ?Managing your Medications? N  ?Managing your Finances? N  ?Housekeeping or managing your Housekeeping? N  ?Some recent data might be hidden  ? ? ?Patient Care Team: ?McGowen, Maryjean Morn, MD as PCP - General (Family Medicine) ?Armbruster, Willaim Rayas, MD as Consulting Physician (Gastroenterology) ? ?Indicate any recent Medical Services you may have received from other than [facility] providers in the past year (date may be approximate). ? ?   ?Assessment:  ? This is Cox routine wellness examination for  Lian. ? ?Hearing/Vision screen ?No results found. ? ?Dietary issues and exercise activities discussed: ?Current Exercise Habits: The patient has Cox physically strenuous job, but has no regular exercise apart from work. ? ? Goals Addressed   ?None ?  ?Depression Screen ?PHQ 2/9 Scores 09/20/2021 01/21/2021 10/29/2019 07/15/2018  ?PHQ - 2 Score 0 0 0 0  ?  ?Fall Risk ?Fall Risk  09/20/2021  ?Falls in the past year? 0  ?Number falls in past yr: 0  ?Injury with Fall? 0  ?Risk for fall due to : No Fall Risks  ?Follow up Falls evaluation completed  ? ? ?FALL RISK PREVENTION PERTAINING TO THE HOME: ? ?Any stairs in or around the home? Yes  ?If so, are there any without handrails? Yes  ?Home free of loose throw rugs in walkways, pet beds, electrical cords, etc? Yes  ?Adequate lighting in your home to reduce risk of falls? Yes  ? ?ASSISTIVE DEVICES UTILIZED TO PREVENT FALLS: ? ?Life alert? No  ?Use of Cox cane, walker or w/c? No  ?Grab bars in the bathroom? No  ?Shower chair or bench in shower? No  ?Elevated toilet seat or Cox handicapped toilet? No  ? ?TIMED UP AND GO: ? ?Was the test performed?  n/Cox .  ?Length of time to ambulate 10 feet: n/Cox sec.  ? ? ? ?Cognitive Function: ?  ?  ?6CIT Screen 09/20/2021  ?What Year? 0 points  ?What month? 0 points  ?What time? 0 points  ?Count back from 20 0 points   ?Months in reverse 0 points  ?Repeat phrase 0 points  ?Total Score 0  ? ? ?Immunizations ?Immunization History  ?Administered Date(s) Administered  ? Fluad Quad(high Dose 65+) 04/27/2020  ? Influenza,inj,Quad

## 2021-12-28 ENCOUNTER — Other Ambulatory Visit: Payer: Self-pay

## 2021-12-28 MED ORDER — HYDROCHLOROTHIAZIDE 25 MG PO TABS: ORAL_TABLET | ORAL | 0 refills | Status: DC

## 2021-12-28 MED ORDER — ATORVASTATIN CALCIUM 20 MG PO TABS: ORAL_TABLET | ORAL | 0 refills | Status: DC

## 2022-01-31 ENCOUNTER — Other Ambulatory Visit: Payer: Self-pay

## 2022-03-03 ENCOUNTER — Telehealth: Payer: Self-pay | Admitting: Family Medicine

## 2022-03-03 MED ORDER — ATORVASTATIN CALCIUM 20 MG PO TABS: ORAL_TABLET | ORAL | 0 refills | Status: DC

## 2022-03-03 MED ORDER — HYDROCHLOROTHIAZIDE 25 MG PO TABS: ORAL_TABLET | ORAL | 0 refills | Status: DC

## 2022-03-03 NOTE — Telephone Encounter (Signed)
Pt would like to know if he can have a refill of Atorvastatin until his physical on Sept 6th. He states he is all out of his cholesterol medication.

## 2022-03-03 NOTE — Telephone Encounter (Signed)
Patient advised refill sent until scheduled appointment.

## 2022-03-07 ENCOUNTER — Encounter: Payer: BLUE CROSS/BLUE SHIELD | Admitting: Family Medicine

## 2022-03-10 ENCOUNTER — Encounter: Payer: BLUE CROSS/BLUE SHIELD | Admitting: Family Medicine

## 2022-03-15 ENCOUNTER — Ambulatory Visit (INDEPENDENT_AMBULATORY_CARE_PROVIDER_SITE_OTHER): Payer: Medicare Other | Admitting: Family Medicine

## 2022-03-15 ENCOUNTER — Encounter: Payer: Self-pay | Admitting: Family Medicine

## 2022-03-15 VITALS — BP 136/60 | HR 79 | Temp 97.7°F | Ht 73.75 in | Wt 210.2 lb

## 2022-03-15 DIAGNOSIS — I10 Essential (primary) hypertension: Secondary | ICD-10-CM

## 2022-03-15 DIAGNOSIS — E7800 Pure hypercholesterolemia, unspecified: Secondary | ICD-10-CM

## 2022-03-15 DIAGNOSIS — L819 Disorder of pigmentation, unspecified: Secondary | ICD-10-CM

## 2022-03-15 DIAGNOSIS — L821 Other seborrheic keratosis: Secondary | ICD-10-CM | POA: Diagnosis not present

## 2022-03-15 DIAGNOSIS — Z23 Encounter for immunization: Secondary | ICD-10-CM

## 2022-03-15 DIAGNOSIS — Z125 Encounter for screening for malignant neoplasm of prostate: Secondary | ICD-10-CM | POA: Diagnosis not present

## 2022-03-15 MED ORDER — ATORVASTATIN CALCIUM 20 MG PO TABS: ORAL_TABLET | ORAL | 3 refills | Status: DC

## 2022-03-15 MED ORDER — HYDROCHLOROTHIAZIDE 25 MG PO TABS: ORAL_TABLET | ORAL | 3 refills | Status: DC

## 2022-03-15 NOTE — Progress Notes (Signed)
Office Note 03/15/2022  CC:  Chief Complaint  Patient presents with   Annual Exam    Pt is fasting    HPI:  Patient is a 66 y.o. male being seen for f/u HTN and HLD. I last saw him 14 mo ago. A/P as of that visit: "1) HTN: well controlled with hctz 25 qd. Lytes/cr today.   2) HLD: tolerating atorva 20 qd. Goal LDL <100. FLP and hepatic panel today.   3) Preventative health care: Vaccines: prevnar 20 due->given today. Otherwise UTD, including shingrix. Labs: fasting HP labs, PSA.  He declines hep c screening. Prostate ca screening: hx of mildly elevated PSA, returned to normal 9 mo ago.  PSA today. Colon ca screening: 07/2018 no polyps (nearly complete visualization of colon but not quite.  Pt chose not to proceed with virtual colonoscopy)->recall 5 yrs."  INTERIM HX: Feeling well, eating lower carbs x 1 yr, walking 2 mi/day.  Pushmowing yard, working full time. Occasional home blood pressure monitoring shows systolic average 322.  Diastolics around 70 consistently.  Skin lesion anterior neck/upper chest wall x 8-10 yrs.  Brown and waxy/brittle, with small purplish lesion next to it.  ROS as above, plus--> no fevers, no CP, no SOB, no wheezing, no cough, no dizziness, no HAs, no rashes, no melena/hematochezia.  No polyuria or polydipsia.  No myalgias or arthralgias.  No focal weakness, paresthesias, or tremors.  No acute vision or hearing abnormalities.  No dysuria or unusual/new urinary urgency or frequency.  No recent changes in lower legs. No n/v/d or abd pain.  No palpitations.      Past Medical History:  Diagnosis Date   Arthritis    Elevated PSA 07/2018   Hovering a little above and a little below normal 2020-2021; most recent (04/2020) normal.   History of GI bleed    upper and lower->? d/t ASA   Hyperlipidemia    started statin 07/2018; great response.   Positive TB test    postive skin test as child; pt was born in the [place], no BCG has been given to him.  Pt  reports that he was never required to take any TB treatment.  He says he had multiple x-rays and "they never found anything".  Suspect false positive TB skin test??  No old records available.  Pt is self pay, so the Quant Gold TB test is too cost prohibitive.  No known TB-like illness, no known contacts with TB pt   White coat syndrome with hypertension     Past Surgical History:  Procedure Laterality Date   COLONOSCOPY  2006; 12/23/18;02/18/19   12/23/18 colonoscopy aborted due to poor prep and altered anatomy (iliocolonic anastamosis->hx of surgery for gastroschisis). Rpt/double prep-02/2019 still not adequate prep->R col poorly seen->consider virtual clspy. Iliocolonic anast with eryth and inf polyps.   GASTROSCHISIS CLOSURE  1955   HEMANGIOMA EXCISION  2015   from 5th digit right hand   TONSILLECTOMY  1963    Family History  Problem Relation Age of Onset   Skin cancer Mother    Hyperlipidemia Mother    Heart attack Mother 74   Lung cancer Father        smoker   Arthritis Sister    Alzheimer's disease Maternal Grandmother    Heart disease Maternal Grandmother    Hyperlipidemia Maternal Grandmother    Hypertension Maternal Grandmother    Heart attack Maternal Grandmother    Lung cancer Maternal Grandfather        smoker  Heart attack Maternal Grandfather    Stomach cancer Paternal Grandmother    Heart attack Paternal Grandfather 67   Colon cancer Neg Hx    Esophageal cancer Neg Hx    Rectal cancer Neg Hx    Colon polyps Neg Hx     Social History   Socioeconomic History   Marital status: Married    Spouse name: Not on file   Number of children: Not on file   Years of education: Not on file   Highest education level: Not on file  Occupational History   Not on file  Tobacco Use   Smoking status: Never   Smokeless tobacco: Never  Vaping Use   Vaping Use: Never used  Substance and Sexual Activity   Alcohol use: Yes    Comment: 1-2 glasses of wine daily   Drug use:  Never   Sexual activity: Not on file  Other Topics Concern   Not on file  Social History Narrative   Married, 3 sons---[name], [name].   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   No tob/drugs.   Alc: 1 glass wine per evening.   Social Determinants of Health   Financial Resource Strain: Low Risk  (09/20/2021)   Overall Financial Resource Strain (CARDIA)    Difficulty of Paying Living Expenses: Not hard at all  Food Insecurity: No Food Insecurity (09/20/2021)   Hunger Vital Sign    Worried About Running Out of Food in the Last Year: Never true    Ran Out of Food in the Last Year: Never true  Transportation Needs: No Transportation Needs (09/20/2021)   PRAPARE - Administrator, Civil Service (Medical): No    Lack of Transportation (Non-Medical): No  Physical Activity: Not on file  Stress: Not on file  Social Connections: Socially Integrated (09/20/2021)   Social Connection and Isolation Panel [NHANES]    Frequency of Communication with Friends and Family: More than three times a week    Frequency of Social Gatherings with Friends and Family: More than three times a week    Attends Religious Services: More than 4 times per year    Active Member of [employer] or Organizations: Yes    Attends Engineer, structural: More than 4 times per year    Marital Status: Married  Catering manager Violence: Not At Risk (09/20/2021)   Humiliation, Afraid, Rape, and Kick questionnaire    Fear of Current or Ex-Partner: No    Emotionally Abused: No    Physically Abused: No    Sexually Abused: No    Outpatient Medications Prior to Visit  Medication Sig Dispense Refill   Multiple Vitamins-Minerals (CENTRUM ADULTS) TABS Take 1 tablet by mouth daily.     Omega-3 Fatty Acids (FISH OIL PO) Take 1 capsule by mouth daily. 1200 mg daily     atorvastatin (LIPITOR) 20 MG tablet TAKE 1 TABLET(20 MG) BY MOUTH DAILY 30 tablet 0    hydrochlorothiazide (HYDRODIURIL) 25 MG tablet TAKE 1 TABLET(25 MG) BY MOUTH DAILY 30 tablet 0   0.9 %  sodium chloride infusion      No facility-administered medications prior to visit.    No Known Allergies  PE;    03/15/2022    2:57 PM 03/15/2022    2:41 PM 01/21/2021    9:42 AM  Vitals with BMI  Height  6' 1.75" 6\' 1"   Weight  210 lbs 3 oz 211 lbs 13 oz  BMI  123456 [id number]  Systolic [id number]  Diastolic 60 69 73  Pulse  79 92    Gen: Alert, well appearing.  Patient is oriented to person, place, time, and situation. AFFECT: pleasant, lucid thought and speech. Left sternoclavicular area--1 cm waxy and rough lesion consistent with seborrheic keratosis. About 1 mm superior to this is a 2 mm oval violaceous/dark brown flat papule.  Blanching equivocal.  Pertinent labs:  Lab Results  Component Value Date   TSH 2.11 10/29/2019   Lab Results  Component Value Date   WBC 9.5 01/21/2021   HGB 15.4 01/21/2021   HCT 43.9 01/21/2021   MCV 92.5 01/21/2021   PLT 169.0 01/21/2021   Lab Results  Component Value Date   CREATININE 0.97 01/21/2021   BUN 13 01/21/2021   NA 142 01/21/2021   K 4.4 01/21/2021   CL 102 01/21/2021   CO2 31 01/21/2021   Lab Results  Component Value Date   ALT 11 01/21/2021   AST 37 01/21/2021   ALKPHOS 102 01/21/2021   BILITOT 0.8 01/21/2021   Lab Results  Component Value Date   CHOL 143 01/21/2021   Lab Results  Component Value Date   HDL 45.10 01/21/2021   Lab Results  Component Value Date   LDLCALC 76 01/21/2021   Lab Results  Component Value Date   TRIG 107.0 01/21/2021   Lab Results  Component Value Date   CHOLHDL 3 01/21/2021   Lab Results  Component Value Date   PSA 5.22 (H) 01/21/2021   PSA 3.70 04/27/2020   PSA 5.04 (H) 10/29/2019   ASSESSMENT AND PLAN:   #1 hypertension, not ideal control from a systolic standpoint. He does not want to add any medication. Continue HCTZ 25 mg a day. Electrolytes and creatinine  today.    #2 hyperlipidemia, doing well on a atorvastatin 20 mg a day. Lipid and hepatic panel today.    3 pigmented skin lesion: Shave excision today, sent to path. L sternoclavicular lesion 2 mm x 1 mm. Consent obtained. Area cleaned/prepped. 1 cc of 1% lidocaine with epi used for local anesthesia. Derma blade used to shave entire lesion. No bleeding.  No immediate complications. Patient tolerated procedure well. Lesion sent to pathology.  Wound care instructions given.  #4 preventative health care  Labs: fasting HP labs, PSA. Prostate ca screening: hx of mildly elevated PSA, returned to normal, then slightly elev last check 14 mo ago.  PSA today. Colon ca screening: 07/2018 no polyps (nearly complete visualization of colon but not quite.  Pt chose not to proceed with virtual colonoscopy)->recall 5 yrs  An After Visit Summary was printed and given to the patient.  FOLLOW UP:  Return in about 1 year (around 03/16/2023) for routine chronic illness f/u.  Signed:  Crissie Sickles, MD           03/15/2022

## 2022-03-16 LAB — CBC WITH DIFFERENTIAL/PLATELET
Basophils Absolute: 0 10*3/uL (ref 0.0–0.1)
Basophils Relative: 0.4 % (ref 0.0–3.0)
Eosinophils Absolute: 0.1 10*3/uL (ref 0.0–0.7)
Eosinophils Relative: 0.6 % (ref 0.0–5.0)
HCT: 45.4 % (ref 39.0–52.0)
Hemoglobin: 15.6 g/dL (ref 13.0–17.0)
Lymphocytes Relative: 40.7 % (ref 12.0–46.0)
Lymphs Abs: 4.2 10*3/uL — ABNORMAL HIGH (ref 0.7–4.0)
MCHC: 34.3 g/dL (ref 30.0–36.0)
MCV: 95.3 fl (ref 78.0–100.0)
Monocytes Absolute: 0.4 10*3/uL (ref 0.1–1.0)
Monocytes Relative: 4.1 % (ref 3.0–12.0)
Neutro Abs: 5.6 10*3/uL (ref 1.4–7.7)
Neutrophils Relative %: 54.2 % (ref 43.0–77.0)
Platelets: 155 10*3/uL (ref 150.0–400.0)
RBC: 4.76 Mil/uL (ref 4.22–5.81)
RDW: 13.3 % (ref 11.5–15.5)
WBC: 10.3 10*3/uL (ref 4.0–10.5)

## 2022-03-16 LAB — LIPID PANEL
Cholesterol: 136 mg/dL (ref 0–200)
HDL: 52.8 mg/dL (ref >39.00)
LDL Cholesterol: 63 mg/dL (ref 0–99)
NonHDL: 82.84
Total CHOL/HDL Ratio: 3
Triglycerides: 99 mg/dL (ref 0.0–149.0)
VLDL: 19.8 mg/dL (ref 0.0–40.0)

## 2022-03-16 LAB — COMPREHENSIVE METABOLIC PANEL
ALT: 14 U/L (ref 0–53)
AST: 41 U/L — ABNORMAL HIGH (ref 0–37)
Albumin: 4.3 g/dL (ref 3.5–5.2)
Alkaline Phosphatase: 95 U/L (ref 39–117)
BUN: 10 mg/dL (ref 6–23)
CO2: 30 mEq/L (ref 19–32)
Calcium: 8.9 mg/dL (ref 8.4–10.5)
Chloride: 98 mEq/L (ref 96–112)
Creatinine, Ser: 0.91 mg/dL (ref 0.40–1.50)
GFR: 86.67 mL/min (ref >60.00)
Glucose, Bld: 88 mg/dL (ref 70–99)
Potassium: 3.9 mEq/L (ref 3.5–5.1)
Sodium: 140 mEq/L (ref 135–145)
Total Bilirubin: 0.9 mg/dL (ref 0.2–1.2)
Total Protein: 6.6 g/dL (ref 6.0–8.3)

## 2022-03-16 LAB — PSA, MEDICARE: PSA: 7.18 ng/ml — ABNORMAL HIGH (ref 0.10–4.00)

## 2022-03-16 LAB — TSH: TSH: 1.95 u[IU]/mL (ref 0.35–5.50)

## 2022-03-17 ENCOUNTER — Telehealth: Payer: Self-pay

## 2022-03-17 ENCOUNTER — Telehealth: Payer: Self-pay | Admitting: Family Medicine

## 2022-03-17 DIAGNOSIS — R972 Elevated prostate specific antigen [PSA]: Secondary | ICD-10-CM

## 2022-03-17 NOTE — Telephone Encounter (Signed)
-----   Message from Jeoffrey Massed, MD sent at 03/16/2022  5:07 PM EDT ----- All labs normal except his PSA is elevated.  It is a little higher than his elevations in the past. I recommend he see a urologist for further evaluation of this.  Please refer to [facility], diagnosis elevated PSA.  Thanks

## 2022-03-17 NOTE — Telephone Encounter (Signed)
Pt returned call regarding test results. Please call patient back.

## 2022-03-17 NOTE — Telephone Encounter (Signed)
Patient advised of results.

## 2022-04-02 ENCOUNTER — Other Ambulatory Visit: Payer: Self-pay | Admitting: Family Medicine

## 2022-04-19 ENCOUNTER — Encounter: Payer: Self-pay | Admitting: Family Medicine

## 2022-05-24 ENCOUNTER — Encounter: Payer: Self-pay | Admitting: Family Medicine

## 2022-06-15 NOTE — Telephone Encounter (Signed)
error 

## 2023-02-07 ENCOUNTER — Ambulatory Visit (INDEPENDENT_AMBULATORY_CARE_PROVIDER_SITE_OTHER): Payer: Medicare Other

## 2023-02-07 VITALS — Wt 210.0 lb

## 2023-02-07 DIAGNOSIS — Z0000 Encounter for general adult medical examination without abnormal findings: Secondary | ICD-10-CM

## 2023-02-07 NOTE — Progress Notes (Signed)
Subjective:   Michael Cox is a 67 y.o. male who presents for Medicare Annual/Subsequent preventive examination.  Vital Signs: Unable to obtain new vitals due to this being a telehealth visit.   Visit Complete: Virtual  I connected with  Michael Cox on 02/07/23 by a audio enabled telemedicine application and verified that I am speaking with the correct person using two identifiers.  Patient Location: Home  Provider Location: Home Office  I discussed the limitations of evaluation and management by telemedicine. The patient expressed understanding and agreed to proceed.   Review of Systems     Cardiac Risk Factors include: advanced age (>54men, >52 women);male gender     Objective:    Today's Vitals   02/07/23 1513  Weight: 210 lb (95.3 kg)   Body mass index is 27.15 kg/m.     02/07/2023    3:16 PM  Advanced Directives  Does Patient Have a Medical Advance Directive? Yes  Type of Estate agent of [place];Living will  Does patient want to make changes to medical advance directive? No - Patient declined  Copy of Healthcare Power of Attorney in Chart? Yes - validated most recent copy scanned in chart (See row information)    Current Medications (verified) Outpatient Encounter Medications as of 02/07/2023  Medication Sig   atorvastatin (LIPITOR) 20 MG tablet TAKE 1 TABLET(20 MG) BY MOUTH DAILY   hydrochlorothiazide (HYDRODIURIL) 25 MG tablet TAKE 1 TABLET(25 MG) BY MOUTH DAILY   Multiple Vitamins-Minerals (CENTRUM ADULTS) TABS Take 1 tablet by mouth daily.   [DISCONTINUED] Omega-3 Fatty Acids (FISH OIL PO) Take 1 capsule by mouth daily. 1200 mg daily   No facility-administered encounter medications on file as of 02/07/2023.    Allergies (verified) Patient has no known allergies.   History: Past Medical History:  Diagnosis Date   Arthritis    BPH without obstruction/lower urinary tract symptoms    Elevated PSA 07/2018   Bx benign 05/16/22    History of GI bleed    upper and lower->? d/t ASA   Hyperlipidemia    started statin 07/2018; great response.   Positive TB test    postive skin test as child; pt was born in the [place], no BCG has been given to him.  Pt reports that he was never required to take any TB treatment.  He says he had multiple x-rays and "they never found anything".  Suspect false positive TB skin test??  No old records available.  Pt is self pay, so the Quant Gold TB test is too cost prohibitive.  No known TB-like illness, no known contacts with TB pt   White coat syndrome with hypertension    Past Surgical History:  Procedure Laterality Date   COLONOSCOPY  2006; 12/23/18;02/18/19   12/23/18 colonoscopy aborted due to poor prep and altered anatomy (iliocolonic anastamosis->hx of surgery for gastroschisis). Rpt/double prep-02/2019 still not adequate prep->R col poorly seen->consider virtual clspy. Iliocolonic anast with eryth and inf polyps.   GASTROSCHISIS CLOSURE  1955   HEMANGIOMA EXCISION  2015   from 5th digit right hand   PROSTATE BIOPSY     05/16/22 benign   TONSILLECTOMY  1963   Family History  Problem Relation Age of Onset   Skin cancer Mother    Hyperlipidemia Mother    Heart attack Mother 9   Lung cancer Father        smoker   Arthritis Sister    Alzheimer's disease Maternal Grandmother  Heart disease Maternal Grandmother    Hyperlipidemia Maternal Grandmother    Hypertension Maternal Grandmother    Heart attack Maternal Grandmother    Lung cancer Maternal Grandfather        smoker   Heart attack Maternal Grandfather    Stomach cancer Paternal Grandmother    Heart attack Paternal Grandfather 57   Colon cancer Neg Hx    Esophageal cancer Neg Hx    Rectal cancer Neg Hx    Colon polyps Neg Hx    Social History   Socioeconomic History   Marital status: Married    Spouse name: Not on file   Number of children: Not on file   Years of education: Not on file   Highest education level: Not  on file  Occupational History   Not on file  Tobacco Use   Smoking status: Never   Smokeless tobacco: Never  Vaping Use   Vaping status: Never Used  Substance and Sexual Activity   Alcohol use: Yes    Comment: 1-2 glasses of wine daily   Drug use: Never   Sexual activity: Not on file  Other Topics Concern   Not on file  Social History Narrative   Married, 3 sons---[name], [name].   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   No tob/drugs.   Alc: 1 glass wine per evening.   Social Determinants of Health   Financial Resource Strain: Low Risk  (02/07/2023)   Overall Financial Resource Strain (CARDIA)    Difficulty of Paying Living Expenses: Not hard at all  Food Insecurity: No Food Insecurity (02/07/2023)   Hunger Vital Sign    Worried About Running Out of Food in the Last Year: Never true    Ran Out of Food in the Last Year: Never true  Transportation Needs: No Transportation Needs (02/07/2023)   PRAPARE - Administrator, Civil Service (Medical): No    Lack of Transportation (Non-Medical): No  Physical Activity: Inactive (02/07/2023)   Exercise Vital Sign    Days of Exercise per Week: 0 days    Minutes of Exercise per Session: 0 min  Stress: No Stress Concern Present (02/07/2023)   [employer] of Occupational Health - Occupational Stress Questionnaire    Feeling of Stress : Not at all  Social Connections: Socially Integrated (02/07/2023)   Social Connection and Isolation Panel [NHANES]    Frequency of Communication with Friends and Family: More than three times a week    Frequency of Social Gatherings with Friends and Family: More than three times a week    Attends Religious Services: More than 4 times per year    Active Member of [employer] or Organizations: Yes    Attends Banker Meetings: 1 to 4 times per year    Marital Status: Married    Tobacco Counseling Counseling given: Not  Answered   Clinical Intake:  Pre-visit preparation completed: Yes  Pain : No/denies pain     BMI - recorded: 27.15 Nutritional Status: BMI 25 -29 Overweight Nutritional Risks: None Diabetes: No  How often do you need to have someone help you when you read instructions, pamphlets, or other written materials from your doctor or pharmacy?: 1 - Never  Interpreter Needed?: No  Comments: Lanier Ensign, LPN   Activities of Daily Living    02/07/2023    3:13 PM  In your present state of health, do you have any difficulty performing the following  activities:  Hearing? 0  Vision? 0  Difficulty concentrating or making decisions? 0  Walking or climbing stairs? 0  Dressing or bathing? 0  Doing errands, shopping? 0  Preparing Food and eating ? N  Using the Toilet? N  In the past six months, have you accidently leaked urine? N  Do you have problems with loss of bowel control? N  Managing your Medications? N  Managing your Finances? N  Housekeeping or managing your Housekeeping? N    Patient Care Team: Jeoffrey Massed, MD as PCP - General (Family Medicine) Armbruster, Willaim Rayas, MD as Consulting Physician (Gastroenterology) Crista Elliot, MD as Consulting Physician (Urology)  Indicate any recent Medical Services you may have received from other than [facility] providers in the past year (date may be approximate).     Assessment:   This is a routine wellness examination for [place].  Hearing/Vision screen Hearing Screening - Comments:: Pt denies any hearing issue  Vision Screening - Comments:: Pt follows up with Dr Corky Mull for annual eye exams   Dietary issues and exercise activities discussed:     Goals Addressed             This Visit's Progress    Patient Stated       Lose another 10 lbs        Depression Screen    02/07/2023    3:16 PM 03/15/2022    2:50 PM 09/20/2021    2:24 PM 01/21/2021    9:41 AM 10/29/2019   10:22 AM 07/15/2018    3:08 PM  PHQ 2/9  Scores  PHQ - 2 Score 0 0 0 0 0 0    Fall Risk    02/07/2023    3:18 PM 03/15/2022    2:50 PM 09/20/2021    2:27 PM  Fall Risk   Falls in the past year? 0 0 0  Number falls in past yr: 0 0 0  Injury with Fall? 0 0 0  Risk for fall due to : Impaired vision Impaired vision No Fall Risks  Follow up Falls prevention discussed Falls evaluation completed Falls evaluation completed    MEDICARE RISK AT HOME:  Medicare Risk at Home - 02/07/23 1518     Any stairs in or around the home? Yes    If so, are there any without handrails? No    Home free of loose throw rugs in walkways, pet beds, electrical cords, etc? Yes    Adequate lighting in your home to reduce risk of falls? Yes    Life alert? No    Use of a cane, walker or w/c? No    Grab bars in the bathroom? Yes    Shower chair or bench in shower? No    Elevated toilet seat or a handicapped toilet? Yes             TIMED UP AND GO:  Was the test performed?  No    Cognitive Function:        02/07/2023    3:18 PM 09/20/2021    2:27 PM  6CIT Screen  What Year? 0 points 0 points  What month? 0 points 0 points  What time? 0 points 0 points  Count back from 20 0 points 0 points  Months in reverse 0 points 0 points  Repeat phrase 0 points 0 points  Total Score 0 points 0 points    Immunizations Immunization History  Administered Date(s) Administered   [employer]  Quad(high Dose 65+) 04/27/2020, 03/15/2022   Influenza,inj,Quad PF,6+ Mos 07/15/2018   Moderna SARS-COV2 Booster Vaccination 05/30/2020   Moderna Sars-Covid-2 Vaccination 08/12/2019, 09/11/2019   PNEUMOCOCCAL CONJUGATE-20 01/21/2021   Pneumococcal Polysaccharide-23 10/29/2019   Tdap 11/22/2013   Zoster Recombinant(Shingrix) 07/15/2018, 01/07/2019   Zoster, Live 11/22/2013    TDAP status: Up to date  Flu Vaccine status: Up to date  Pneumococcal vaccine status: Up to date  Covid-19 vaccine status: Information provided on how to obtain vaccines.   Qualifies  for Shingles Vaccine? Yes   Zostavax completed Yes   Shingrix Completed?: Yes  Screening Tests Health Maintenance  Topic Date Due   COVID-19 Vaccine (3 - Moderna risk series) 06/27/2020   INFLUENZA VACCINE  02/08/2023   DTaP/Tdap/Td (2 - Td or Tdap) 11/23/2023   Medicare Annual Wellness (AWV)  02/07/2024   Colonoscopy  02/17/2029   Pneumonia Vaccine 69+ Years old  Completed   Zoster Vaccines- Shingrix  Completed   HPV VACCINES  Aged Out   Hepatitis C Screening  Discontinued    Health Maintenance  Health Maintenance Due  Topic Date Due   COVID-19 Vaccine (3 - Moderna risk series) 06/27/2020    Colorectal cancer screening: Type of screening: Colonoscopy. Completed 02/18/19. Repeat every 10 years    Additional Screening:  Hepatitis C Screening:  Completed 01/21/21  Vision Screening: Recommended annual ophthalmology exams for early detection of glaucoma and other disorders of the eye. Is the patient up to date with their annual eye exam?  Yes  Who is the provider or what is the name of the office in which the patient attends annual eye exams? Dr Corky Mull  If pt is not established with a provider, would they like to be referred to a provider to establish care? No .   Dental Screening: Recommended annual dental exams for proper oral hygiene    Community Resource Referral / Chronic Care Management: CRR required this visit?  No   CCM required this visit?  No     Plan:     I have personally reviewed and noted the following in the patient's chart:   Medical and social history Use of alcohol, tobacco or illicit drugs  Current medications and supplements including opioid prescriptions. Patient is not currently taking opioid prescriptions. Functional ability and status Nutritional status Physical activity Advanced directives List of other physicians Hospitalizations, surgeries, and ER visits in previous 12 months Vitals Screenings to include cognitive, depression,  and falls Referrals and appointments  In addition, I have reviewed and discussed with patient certain preventive protocols, quality metrics, and best practice recommendations. A written personalized care plan for preventive services as well as general preventive health recommendations were provided to patient.     Marzella Schlein, LPN   0/04/2724   After Visit Summary: (MyChart) Due to this being a telephonic visit, the after visit summary with patients personalized plan was offered to patient via MyChart   Nurse Notes: none

## 2023-02-07 NOTE — Patient Instructions (Signed)
Michael Cox , Thank you for taking time to come for your Medicare Wellness Visit. I appreciate your ongoing commitment to your health goals. Please review the following plan we discussed and let me know if I can assist you in the future.   Referrals/Orders/Follow-Ups/Clinician Recommendations: lose 10 lbs   This is a list of the screening recommended for you and due dates:  Health Maintenance  Topic Date Due   COVID-19 Vaccine (3 - Moderna risk series) 06/27/2020   Flu Shot  02/08/2023   DTaP/Tdap/Td vaccine (2 - Td or Tdap) 11/23/2023   Medicare Annual Wellness Visit  02/07/2024   Colon Cancer Screening  02/17/2029   Pneumonia Vaccine  Completed   Zoster (Shingles) Vaccine  Completed   HPV Vaccine  Aged Out   Hepatitis C Screening  Discontinued    Advanced directives: (In Chart) A copy of your advanced directives are scanned into your chart should your provider ever need it.  Next Medicare Annual Wellness Visit scheduled for next year: Yes  Preventive Care 63 Years and Older, Male  Preventive care refers to lifestyle choices and visits with your health care provider that can promote health and wellness. What does preventive care include? A yearly physical exam. This is also called an annual well check. Dental exams once or twice a year. Routine eye exams. Ask your health care provider how often you should have your eyes checked. Personal lifestyle choices, including: Daily care of your teeth and gums. Regular physical activity. Eating a healthy diet. Avoiding tobacco and drug use. Limiting alcohol use. Practicing safe sex. Taking low doses of aspirin every day. Taking vitamin and mineral supplements as recommended by your health care provider. What happens during an annual well check? The services and screenings done by your health care provider during your annual well check will depend on your age, overall health, lifestyle risk factors, and family history of  disease. Counseling  Your health care provider may ask you questions about your: Alcohol use. Tobacco use. Drug use. Emotional well-being. Home and relationship well-being. Sexual activity. Eating habits. History of falls. Memory and ability to understand (cognition). Work and work Astronomer. Screening  You may have the following tests or measurements: Height, weight, and BMI. Blood pressure. Lipid and cholesterol levels. These may be checked every 5 years, or more frequently if you are over 32 years old. Skin check. Lung cancer screening. You may have this screening every year starting at age 46 if you have a 30-pack-year history of smoking and currently smoke or have quit within the past 15 years. Fecal occult blood test (FOBT) of the stool. You may have this test every year starting at age 104. Flexible sigmoidoscopy or colonoscopy. You may have a sigmoidoscopy every 5 years or a colonoscopy every 10 years starting at age 20. Prostate cancer screening. Recommendations will vary depending on your family history and other risks. Hepatitis C blood test. Hepatitis B blood test. Sexually transmitted disease (STD) testing. Diabetes screening. This is done by checking your blood sugar (glucose) after you have not eaten for a while (fasting). You may have this done every 1-3 years. Abdominal aortic aneurysm (AAA) screening. You may need this if you are a current or former smoker. Osteoporosis. You may be screened starting at age 64 if you are at high risk. Talk with your health care provider about your test results, treatment options, and if necessary, the need for more tests. Vaccines  Your health care provider may recommend certain vaccines,  such as: Influenza vaccine. This is recommended every year. Tetanus, diphtheria, and acellular pertussis (Tdap, Td) vaccine. You may need a Td booster every 10 years. Zoster vaccine. You may need this after age 36. Pneumococcal 13-valent  conjugate (PCV13) vaccine. One dose is recommended after age 37. Pneumococcal polysaccharide (PPSV23) vaccine. One dose is recommended after age 31. Talk to your health care provider about which screenings and vaccines you need and how often you need them. This information is not intended to replace advice given to you by your health care provider. Make sure you discuss any questions you have with your health care provider. Document Released: 07/23/2015 Document Revised: 03/15/2016 Document Reviewed: 04/27/2015 Elsevier Interactive Patient Education  2017 [employer].  Fall Prevention in the Home Falls can cause injuries. They can happen to people of all ages. There are many things you can do to make your home safe and to help prevent falls. What can I do on the outside of my home? Regularly fix the edges of walkways and driveways and fix any cracks. Remove anything that might make you trip as you walk through a door, such as a raised step or threshold. Trim any bushes or trees on the path to your home. Use bright outdoor lighting. Clear any walking paths of anything that might make someone trip, such as rocks or tools. Regularly check to see if handrails are loose or broken. Make sure that both sides of any steps have handrails. Any raised decks and porches should have guardrails on the edges. Have any leaves, snow, or ice cleared regularly. Use sand or salt on walking paths during winter. Clean up any spills in your garage right away. This includes oil or grease spills. What can I do in the bathroom? Use night lights. Install grab bars by the toilet and in the tub and shower. Do not use towel bars as grab bars. Use non-skid mats or decals in the tub or shower. If you need to sit down in the shower, use a plastic, non-slip stool. Keep the floor dry. Clean up any water that spills on the floor as soon as it happens. Remove soap buildup in the tub or shower regularly. Attach bath mats  securely with double-sided non-slip rug tape. Do not have throw rugs and other things on the floor that can make you trip. What can I do in the bedroom? Use night lights. Make sure that you have a light by your bed that is easy to reach. Do not use any sheets or blankets that are too big for your bed. They should not hang down onto the floor. Have a firm chair that has side arms. You can use this for support while you get dressed. Do not have throw rugs and other things on the floor that can make you trip. What can I do in the kitchen? Clean up any spills right away. Avoid walking on wet floors. Keep items that you use a lot in easy-to-reach places. If you need to reach something above you, use a strong step stool that has a grab bar. Keep electrical cords out of the way. Do not use floor polish or wax that makes floors slippery. If you must use wax, use non-skid floor wax. Do not have throw rugs and other things on the floor that can make you trip. What can I do with my stairs? Do not leave any items on the stairs. Make sure that there are handrails on both sides of the stairs and  use them. Fix handrails that are broken or loose. Make sure that handrails are as long as the stairways. Check any carpeting to make sure that it is firmly attached to the stairs. Fix any carpet that is loose or worn. Avoid having throw rugs at the top or bottom of the stairs. If you do have throw rugs, attach them to the floor with carpet tape. Make sure that you have a light switch at the top of the stairs and the bottom of the stairs. If you do not have them, ask someone to add them for you. What else can I do to help prevent falls? Wear shoes that: Do not have high heels. Have rubber bottoms. Are comfortable and fit you well. Are closed at the toe. Do not wear sandals. If you use a stepladder: Make sure that it is fully opened. Do not climb a closed stepladder. Make sure that both sides of the stepladder  are locked into place. Ask someone to hold it for you, if possible. Clearly mark and make sure that you can see: Any grab bars or handrails. First and last steps. Where the edge of each step is. Use tools that help you move around (mobility aids) if they are needed. These include: Canes. Walkers. Scooters. Crutches. Turn on the lights when you go into a dark area. Replace any light bulbs as soon as they burn out. Set up your furniture so you have a clear path. Avoid moving your furniture around. If any of your floors are uneven, fix them. If there are any pets around you, be aware of where they are. Review your medicines with your doctor. Some medicines can make you feel dizzy. This can increase your chance of falling. Ask your doctor what other things that you can do to help prevent falls. This information is not intended to replace advice given to you by your health care provider. Make sure you discuss any questions you have with your health care provider. Document Released: 04/22/2009 Document Revised: 12/02/2015 Document Reviewed: 07/31/2014 Elsevier Interactive Patient Education  2017 [employer].

## 2023-02-22 ENCOUNTER — Encounter (INDEPENDENT_AMBULATORY_CARE_PROVIDER_SITE_OTHER): Payer: Self-pay

## 2023-03-19 ENCOUNTER — Ambulatory Visit: Payer: BLUE CROSS/BLUE SHIELD | Admitting: Family Medicine

## 2023-03-19 NOTE — Progress Notes (Deleted)
Office Note 03/19/2023  CC: No chief complaint on file.   HPI:  Patient is a 67 y.o. male who is here for annual health maintenance exam and follow-up hypertension and hypercholesterolemia.  Past Medical History:  Diagnosis Date   Arthritis    BPH without obstruction/lower urinary tract symptoms    Elevated PSA 07/2018   Bx benign 05/16/22   History of GI bleed    upper and lower->? d/t ASA   Hyperlipidemia    started statin 07/2018; great response.   Positive TB test    postive skin test as child; pt was born in the [place], no BCG has been given to him.  Pt reports that he was never required to take any TB treatment.  He says he had multiple x-rays and "they never found anything".  Suspect false positive TB skin test??  No old records available.  Pt is self pay, so the Quant Gold TB test is too cost prohibitive.  No known TB-like illness, no known contacts with TB pt   White coat syndrome with hypertension     Past Surgical History:  Procedure Laterality Date   COLONOSCOPY  2006; 12/23/18;02/18/19   12/23/18 colonoscopy aborted due to poor prep and altered anatomy (iliocolonic anastamosis->hx of surgery for gastroschisis). Rpt/double prep-02/2019 still not adequate prep->R col poorly seen->consider virtual clspy. Iliocolonic anast with eryth and inf polyps.   GASTROSCHISIS CLOSURE  1955   HEMANGIOMA EXCISION  2015   from 5th digit right hand   PROSTATE BIOPSY     05/16/22 benign   TONSILLECTOMY  1963    Family History  Problem Relation Age of Onset   Skin cancer Mother    Hyperlipidemia Mother    Heart attack Mother 64   Lung cancer Father        smoker   Arthritis Sister    Alzheimer's disease Maternal Grandmother    Heart disease Maternal Grandmother    Hyperlipidemia Maternal Grandmother    Hypertension Maternal Grandmother    Heart attack Maternal Grandmother    Lung cancer Maternal Grandfather        smoker   Heart attack Maternal Grandfather    Stomach cancer  Paternal Grandmother    Heart attack Paternal Grandfather 71   Colon cancer Neg Hx    Esophageal cancer Neg Hx    Rectal cancer Neg Hx    Colon polyps Neg Hx     Social History   Socioeconomic History   Marital status: Married    Spouse name: Not on file   Number of children: Not on file   Years of education: Not on file   Highest education level: Not on file  Occupational History   Not on file  Tobacco Use   Smoking status: Never   Smokeless tobacco: Never  Vaping Use   Vaping status: Never Used  Substance and Sexual Activity   Alcohol use: Yes    Comment: 1-2 glasses of wine daily   Drug use: Never   Sexual activity: Not on file  Other Topics Concern   Not on file  Social History Narrative   Married, 3 sons---[name], [name].   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   No tob/drugs.   Alc: 1 glass wine per evening.   Social Determinants of Health   Financial Resource Strain: Low Risk  (02/07/2023)   Overall Financial Resource Strain (CARDIA)    Difficulty of Paying Living Expenses: Not hard  at all  Food Insecurity: No Food Insecurity (02/07/2023)   Hunger Vital Sign    Worried About Running Out of Food in the Last Year: Never true    Ran Out of Food in the Last Year: Never true  Transportation Needs: No Transportation Needs (02/07/2023)   PRAPARE - Administrator, Civil Service (Medical): No    Lack of Transportation (Non-Medical): No  Physical Activity: Inactive (02/07/2023)   Exercise Vital Sign    Days of Exercise per Week: 0 days    Minutes of Exercise per Session: 0 min  Stress: No Stress Concern Present (02/07/2023)   [employer] of Occupational Health - Occupational Stress Questionnaire    Feeling of Stress : Not at all  Social Connections: Socially Integrated (02/07/2023)   Social Connection and Isolation Panel [NHANES]    Frequency of Communication with Friends and Family:  More than three times a week    Frequency of Social Gatherings with Friends and Family: More than three times a week    Attends Religious Services: More than 4 times per year    Active Member of [employer] or Organizations: Yes    Attends Banker Meetings: 1 to 4 times per year    Marital Status: Married  Catering manager Violence: Not At Risk (02/07/2023)   Humiliation, Afraid, Rape, and Kick questionnaire    Fear of Current or Ex-Partner: No    Emotionally Abused: No    Physically Abused: No    Sexually Abused: No    Outpatient Medications Prior to Visit  Medication Sig Dispense Refill   atorvastatin (LIPITOR) 20 MG tablet TAKE 1 TABLET(20 MG) BY MOUTH DAILY 90 tablet 2   hydrochlorothiazide (HYDRODIURIL) 25 MG tablet TAKE 1 TABLET(25 MG) BY MOUTH DAILY 90 tablet 2   Multiple Vitamins-Minerals (CENTRUM ADULTS) TABS Take 1 tablet by mouth daily.     No facility-administered medications prior to visit.    No Known Allergies  Review of Systems *** PE;    02/07/2023    3:13 PM 03/15/2022    2:57 PM 03/15/2022    2:41 PM  Vitals with BMI  Height   6' 1.75"  Weight 210 lbs  210 lbs 3 oz  BMI   27.18  Systolic  136 146  Diastolic  60 69  Pulse   79     *** Pertinent labs:  Lab Results  Component Value Date   TSH 1.95 03/15/2022   Lab Results  Component Value Date   WBC 10.3 03/15/2022   HGB 15.6 03/15/2022   HCT 45.4 03/15/2022   MCV 95.3 03/15/2022   PLT 155.0 03/15/2022   Lab Results  Component Value Date   CREATININE 0.91 03/15/2022   BUN 10 03/15/2022   NA 140 03/15/2022   K 3.9 03/15/2022   CL 98 03/15/2022   CO2 30 03/15/2022   Lab Results  Component Value Date   ALT 14 03/15/2022   AST 41 (H) 03/15/2022   ALKPHOS 95 03/15/2022   BILITOT 0.9 03/15/2022   Lab Results  Component Value Date   CHOL 136 03/15/2022   Lab Results  Component Value Date   HDL 52.80 03/15/2022   Lab Results  Component Value Date   LDLCALC 63 03/15/2022    Lab Results  Component Value Date   TRIG 99.0 03/15/2022   Lab Results  Component Value Date   CHOLHDL 3 03/15/2022   Lab Results  Component Value Date   PSA 7.18 (  H) 03/15/2022   PSA 5.22 (H) 01/21/2021   PSA 3.70 04/27/2020   ASSESSMENT AND PLAN:   No problem-specific Assessment & Plan notes found for this encounter. Health maintenance exam: Reviewed age and gender appropriate health maintenance issues (prudent diet, regular exercise, health risks of tobacco and excessive alcohol, use of seatbelts, fire alarms in home, use of sunscreen).  Also reviewed age and gender appropriate health screening as well as vaccine recommendations. Vaccines: Flu->***. otherwise up-to-date Labs: fasting lipids, CBC, c-Met, PSA. Prostate ca screening: hx of mildly elevated PSA, most recent was 7.18 one year ago-->prostate bx benign 05/2022.  PSA today. Colon ca screening: 07/2018 no polyps (nearly complete visualization of colon but not quite.  Pt chose not to proceed with virtual colonoscopy)->recall 5 yrs  An After Visit Summary was printed and given to the patient.  FOLLOW UP:  No follow-ups on file.  Signed:  Santiago Bumpers, MD           03/19/2023

## 2023-04-24 ENCOUNTER — Telehealth: Payer: Self-pay

## 2023-04-24 MED ORDER — HYDROCHLOROTHIAZIDE 25 MG PO TABS: ORAL_TABLET | ORAL | 0 refills | Status: DC

## 2023-04-24 MED ORDER — ATORVASTATIN CALCIUM 20 MG PO TABS: ORAL_TABLET | ORAL | 0 refills | Status: DC

## 2023-04-24 NOTE — Telephone Encounter (Signed)
Patient refill request. Scheduled appt with Dr. Milinda Cave on 11/4 - YEARLY APPT - patient has traditional Medicare.  Please fill 30 d/s until appt   Prescription Request  04/24/2023  LOV: Visit date not found  What is the name of the medication or equipment? atorvastatin (LIPITOR) 20 MG tablet   Have you contacted your pharmacy to request a refill? No   Which pharmacy would you like this sent to?  [id number] - [place], [place] - [address] [place] [address] [place] [address] Phone: [phone] Fax: [phone]    Patient notified that their request is being sent to the clinical staff for review and that they should receive a response within 2 business days.   Please advise at Mobile [phone] (mobile)

## 2023-05-14 ENCOUNTER — Encounter: Payer: Self-pay | Admitting: Family Medicine

## 2023-05-14 ENCOUNTER — Ambulatory Visit: Payer: Medicare Other | Admitting: Family Medicine

## 2023-05-14 VITALS — BP 140/78 | HR 77 | Ht 74.0 in | Wt 213.2 lb

## 2023-05-14 DIAGNOSIS — E7800 Pure hypercholesterolemia, unspecified: Secondary | ICD-10-CM

## 2023-05-14 DIAGNOSIS — Z125 Encounter for screening for malignant neoplasm of prostate: Secondary | ICD-10-CM | POA: Diagnosis not present

## 2023-05-14 DIAGNOSIS — I10 Essential (primary) hypertension: Secondary | ICD-10-CM | POA: Diagnosis not present

## 2023-05-14 DIAGNOSIS — Z23 Encounter for immunization: Secondary | ICD-10-CM | POA: Diagnosis not present

## 2023-05-14 DIAGNOSIS — R972 Elevated prostate specific antigen [PSA]: Secondary | ICD-10-CM | POA: Diagnosis not present

## 2023-05-14 LAB — COMPREHENSIVE METABOLIC PANEL
ALT: 13 U/L (ref 0–53)
AST: 38 U/L — ABNORMAL HIGH (ref 0–37)
Albumin: 4.4 g/dL (ref 3.5–5.2)
Alkaline Phosphatase: 104 U/L (ref 39–117)
BUN: 12 mg/dL (ref 6–23)
CO2: 32 meq/L (ref 19–32)
Calcium: 8.8 mg/dL (ref 8.4–10.5)
Chloride: 99 meq/L (ref 96–112)
Creatinine, Ser: 0.99 mg/dL (ref 0.40–1.50)
GFR: 77.7 mL/min (ref >60.00)
Glucose, Bld: 96 mg/dL (ref 70–99)
Potassium: 4.7 meq/L (ref 3.5–5.1)
Sodium: 140 meq/L (ref 135–145)
Total Bilirubin: 1.1 mg/dL (ref 0.2–1.2)
Total Protein: 6.4 g/dL (ref 6.0–8.3)

## 2023-05-14 LAB — CBC WITH DIFFERENTIAL/PLATELET
Basophils Absolute: 0 10*3/uL (ref 0.0–0.1)
Basophils Relative: 0.3 % (ref 0.0–3.0)
Eosinophils Absolute: 0 10*3/uL (ref 0.0–0.7)
Eosinophils Relative: 0.4 % (ref 0.0–5.0)
HCT: 46.2 % (ref 39.0–52.0)
Hemoglobin: 15.5 g/dL (ref 13.0–17.0)
Lymphocytes Relative: 40.9 % (ref 12.0–46.0)
Lymphs Abs: 4.1 10*3/uL — ABNORMAL HIGH (ref 0.7–4.0)
MCHC: 33.6 g/dL (ref 30.0–36.0)
MCV: 96.4 fL (ref 78.0–100.0)
Monocytes Absolute: 0.4 10*3/uL (ref 0.1–1.0)
Monocytes Relative: 3.6 % (ref 3.0–12.0)
Neutro Abs: 5.4 10*3/uL (ref 1.4–7.7)
Neutrophils Relative %: 54.8 % (ref 43.0–77.0)
Platelets: 176 10*3/uL (ref 150.0–400.0)
RBC: 4.79 Mil/uL (ref 4.22–5.81)
RDW: 12.6 % (ref 11.5–15.5)
WBC: 9.9 10*3/uL (ref 4.0–10.5)

## 2023-05-14 LAB — LIPID PANEL
Cholesterol: 132 mg/dL (ref 0–200)
HDL: 46.5 mg/dL (ref >39.00)
LDL Cholesterol: 61 mg/dL (ref 0–99)
NonHDL: 85.26
Total CHOL/HDL Ratio: 3
Triglycerides: 119 mg/dL (ref 0.0–149.0)
VLDL: 23.8 mg/dL (ref 0.0–40.0)

## 2023-05-14 LAB — PSA, MEDICARE: PSA: 7.02 ng/mL — ABNORMAL HIGH (ref 0.10–4.00)

## 2023-05-14 MED ORDER — ATORVASTATIN CALCIUM 20 MG PO TABS: ORAL_TABLET | ORAL | 3 refills | Status: DC

## 2023-05-14 MED ORDER — HYDROCHLOROTHIAZIDE 25 MG PO TABS: ORAL_TABLET | ORAL | 3 refills | Status: DC

## 2023-05-14 MED ORDER — AMLODIPINE BESYLATE 5 MG PO TABS: 5.0000 mg | ORAL_TABLET | Freq: Every day | ORAL | 0 refills | Status: DC

## 2023-05-14 NOTE — Progress Notes (Signed)
Office Note 05/14/2023  CC:  Chief Complaint  Patient presents with   Annual Exam    Pt is fasting.    Patient is a 67 y.o. male who is here for follow-up hypertension and hyperlipidemia. A/P as of last visit: "#1 hypertension, not ideal control from a systolic standpoint. He does not want to add any medication. Continue HCTZ 25 mg a day. Electrolytes and creatinine today.    #2 hyperlipidemia, doing well on a atorvastatin 20 mg a day. Lipid and hepatic panel today.  "  INTERIM HX: Feeling well. Home bp's 140-145/68-70 consistently.  Urol eval last year reassuring. Needs PSA testing today.  Past Medical History:  Diagnosis Date   Arthritis    BPH without obstruction/lower urinary tract symptoms    Elevated PSA 07/2018   Bx benign 05/16/22   History of GI bleed    upper and lower->? d/t ASA   Hyperlipidemia    started statin 07/2018; great response.   Positive TB test    postive skin test as child; pt was born in the [place], no BCG has been given to him.  Pt reports that he was never required to take any TB treatment.  He says he had multiple x-rays and "they never found anything".  Suspect false positive TB skin test??  No old records available.  Pt is self pay, so the Quant Gold TB test is too cost prohibitive.  No known TB-like illness, no known contacts with TB pt   White coat syndrome with hypertension     Past Surgical History:  Procedure Laterality Date   COLONOSCOPY  2006; 12/23/18;02/18/19   12/23/18 colonoscopy aborted due to poor prep and altered anatomy (iliocolonic anastamosis->hx of surgery for gastroschisis). Rpt/double prep-02/2019 still not adequate prep->R col poorly seen->consider virtual clspy. Iliocolonic anast with eryth and inf polyps.   GASTROSCHISIS CLOSURE  1955   HEMANGIOMA EXCISION  2015   from 5th digit right hand   PROSTATE BIOPSY     05/16/22 benign   TONSILLECTOMY  1963    Family History  Problem Relation Age of Onset   Skin cancer Mother     Hyperlipidemia Mother    Heart attack Mother 49   Lung cancer Father        smoker   Arthritis Sister    Alzheimer's disease Maternal Grandmother    Heart disease Maternal Grandmother    Hyperlipidemia Maternal Grandmother    Hypertension Maternal Grandmother    Heart attack Maternal Grandmother    Lung cancer Maternal Grandfather        smoker   Heart attack Maternal Grandfather    Stomach cancer Paternal Grandmother    Heart attack Paternal Grandfather 71   Colon cancer Neg Hx    Esophageal cancer Neg Hx    Rectal cancer Neg Hx    Colon polyps Neg Hx     Social History   Socioeconomic History   Marital status: Married    Spouse name: Not on file   Number of children: Not on file   Years of education: Not on file   Highest education level: Associate degree: academic program  Occupational History   Not on file  Tobacco Use   Smoking status: Never   Smokeless tobacco: Never  Vaping Use   Vaping status: Never Used  Substance and Sexual Activity   Alcohol use: Yes    Comment: 1-2 glasses of wine daily   Drug use: Never   Sexual activity: Not on file  Other Topics Concern   Not on file  Social History Narrative   Married, 3 sons---[name], [name].   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   No tob/drugs.   Alc: 1 glass wine per evening.   Social Determinants of Health   Financial Resource Strain: Low Risk  (05/13/2023)   Overall Financial Resource Strain (CARDIA)    Difficulty of Paying Living Expenses: Not hard at all  Food Insecurity: No Food Insecurity (05/13/2023)   Hunger Vital Sign    Worried About Running Out of Food in the Last Year: Never true    Ran Out of Food in the Last Year: Never true  Transportation Needs: No Transportation Needs (05/13/2023)   PRAPARE - Administrator, Civil Service (Medical): No    Lack of Transportation (Non-Medical): No  Physical Activity:  Insufficiently Active (05/13/2023)   Exercise Vital Sign    Days of Exercise per Week: 5 days    Minutes of Exercise per Session: 20 min  Stress: No Stress Concern Present (05/13/2023)   [employer] of Occupational Health - Occupational Stress Questionnaire    Feeling of Stress : Not at all  Social Connections: Socially Integrated (05/13/2023)   Social Connection and Isolation Panel [NHANES]    Frequency of Communication with Friends and Family: More than three times a week    Frequency of Social Gatherings with Friends and Family: More than three times a week    Attends Religious Services: More than 4 times per year    Active Member of [employer] or Organizations: Yes    Attends Engineer, structural: More than 4 times per year    Marital Status: Married  Catering manager Violence: Not At Risk (02/07/2023)   Humiliation, Afraid, Rape, and Kick questionnaire    Fear of Current or Ex-Partner: No    Emotionally Abused: No    Physically Abused: No    Sexually Abused: No    Outpatient Medications Prior to Visit  Medication Sig Dispense Refill   atorvastatin (LIPITOR) 20 MG tablet TAKE 1 TABLET(20 MG) BY MOUTH DAILY. MUST KEEP APPT FOR FURTHER REFILLS 30 tablet 0   hydrochlorothiazide (HYDRODIURIL) 25 MG tablet TAKE 1 TABLET(25 MG) BY MOUTH DAILY. MUST KEEP APPT FOR FURTHER REFILLS 30 tablet 0   Multiple Vitamins-Minerals (CENTRUM ADULTS) TABS Take 1 tablet by mouth daily.     No facility-administered medications prior to visit.    No Known Allergies  Review of Systems  Constitutional:  Negative for appetite change, chills, fatigue and fever.  HENT:  Negative for congestion, dental problem, ear pain and sore throat.   Eyes:  Negative for discharge, redness and visual disturbance.  Respiratory:  Negative for cough, chest tightness, shortness of breath and wheezing.   Cardiovascular:  Negative for chest pain, palpitations and leg swelling.  Gastrointestinal:  Negative for  abdominal pain, blood in stool, diarrhea, nausea and vomiting.  Genitourinary:  Negative for difficulty urinating, dysuria, flank pain, frequency, hematuria and urgency.  Musculoskeletal:  Negative for arthralgias, back pain, joint swelling, myalgias and neck stiffness.  Skin:  Negative for pallor and rash.  Neurological:  Negative for dizziness, speech difficulty, weakness and headaches.  Hematological:  Negative for adenopathy. Does not bruise/bleed easily.  Psychiatric/Behavioral:  Negative for confusion and sleep disturbance. The patient is not nervous/anxious.    PE;    05/14/2023   10:30 AM 05/14/2023   10:23 AM 02/07/2023    3:13  PM  Vitals with BMI  Height  6\' 2"    Weight  213 lbs 3 oz 210 lbs  BMI  27.36   Systolic 140 156   Diastolic 78 80   Pulse  77    Gen: Alert, well appearing.  Patient is oriented to person, place, time, and situation. AFFECT: pleasant, lucid thought and speech. CV: RRR, no m/r/g.   LUNGS: CTA bilat, nonlabored resps, good aeration in all lung fields. EXT: no clubbing or cyanosis.  no edema.   Pertinent labs:  Lab Results  Component Value Date   TSH 1.95 03/15/2022   Lab Results  Component Value Date   WBC 10.3 03/15/2022   HGB 15.6 03/15/2022   HCT 45.4 03/15/2022   MCV 95.3 03/15/2022   PLT 155.0 03/15/2022   Lab Results  Component Value Date   CREATININE 0.91 03/15/2022   BUN 10 03/15/2022   NA 140 03/15/2022   K 3.9 03/15/2022   CL 98 03/15/2022   CO2 30 03/15/2022   Lab Results  Component Value Date   ALT 14 03/15/2022   AST 41 (H) 03/15/2022   ALKPHOS 95 03/15/2022   BILITOT 0.9 03/15/2022   Lab Results  Component Value Date   CHOL 136 03/15/2022   Lab Results  Component Value Date   HDL 52.80 03/15/2022   Lab Results  Component Value Date   LDLCALC 63 03/15/2022   Lab Results  Component Value Date   TRIG 99.0 03/15/2022   Lab Results  Component Value Date   CHOLHDL 3 03/15/2022   Lab Results  Component  Value Date   PSA 7.18 (H) 03/15/2022   PSA 5.22 (H) 01/21/2021   PSA 3.70 04/27/2020   ASSESSMENT AND PLAN:   1) HTN, not ideal control. Add amlodipine 5mg  every day. Therapeutic expectations and side effect profile of medication discussed today.  Patient's questions answered. Continue hydrochlorothiazide 25mg  every day. Lytes/cr today.  2)  hyperlipidemia, doing well on a atorvastatin 20 mg a day. Lipid and hepatic panel today.   3)  Preventative health care  Labs: fasting HP labs, PSA. Prostate ca screening:  Hx elev PSA, benign bx 05/2022--->due for PSA monitoring today. Colon ca screening: 07/2018 no polyps (nearly complete visualization of colon but not quite.  Pt chose not to proceed with virtual colonoscopy)->recall 5 yrs  An After Visit Summary was printed and given to the patient.  FOLLOW UP:  No follow-ups on file.  Signed:  Santiago Bumpers, MD           05/14/2023

## 2023-05-15 ENCOUNTER — Other Ambulatory Visit: Payer: Self-pay | Admitting: Family Medicine

## 2023-05-15 ENCOUNTER — Telehealth: Payer: Self-pay | Admitting: Family Medicine

## 2023-05-15 NOTE — Telephone Encounter (Signed)
Patient called to inquire about the BP medication change that Dr. Milinda Cave mentioned yesterday. He states that his chart is not updated with the fact that he only takes a half pill of hydrochlorothiazide (HYDRODIURIL) 25 MG tablet. Instead of starting a new medication  with possible side effects. He would like to know if he can take a whole pill now instead of trying a new medication.

## 2023-05-15 NOTE — Telephone Encounter (Signed)
Pt advised.

## 2023-05-15 NOTE — Telephone Encounter (Signed)
Please advise on next steps.

## 2023-05-15 NOTE — Telephone Encounter (Signed)
Okay, I was not aware he is only taking half of the HCTZ tab. Okay to increase this to 1 whole tab daily. Okay to hold off from starting amlodipine. Office follow-up 2 weeks. Michael Cox

## 2024-02-13 ENCOUNTER — Ambulatory Visit (INDEPENDENT_AMBULATORY_CARE_PROVIDER_SITE_OTHER): Payer: Medicare Other | Admitting: *Deleted

## 2024-02-13 VITALS — Ht 75.0 in | Wt 213.0 lb

## 2024-02-13 DIAGNOSIS — Z0000 Encounter for general adult medical examination without abnormal findings: Secondary | ICD-10-CM | POA: Diagnosis not present

## 2024-02-13 NOTE — Progress Notes (Signed)
 Subjective:   Michael Cox is a 68 y.o. male who presents for Medicare Annual/Subsequent preventive examination.  Visit Complete: Virtual I connected with  Michael Cox on 02/13/24 by a audio enabled telemedicine application and verified that I am speaking with the correct person using two identifiers.  Patient Location: Home  Provider Location: Home Office  I discussed the limitations of evaluation and management by telemedicine. The patient expressed understanding and agreed to proceed.  Vital Signs: Because this visit was a virtual/telehealth visit, some criteria may be missing or patient reported. Any vitals not documented were not able to be obtained and vitals that have been documented are patient reported. Cardiac Risk Factors include: advanced age (>57men, >70 women);male gender     Objective:    Today's Vitals   02/13/24 1510  Weight: 213 lb (96.6 kg)  Height: 6' 3 (1.905 m)   Body mass index is 26.62 kg/m.     02/13/2024    3:09 PM 02/07/2023    3:16 PM  Advanced Directives  Does Patient Have a Medical Advance Directive? Yes Yes  Type of Estate agent of [employer] Power of [place];Living will  Does patient want to make changes to medical advance directive?  No - Patient declined  Copy of Healthcare Power of Attorney in Chart? No - copy requested Yes - validated most recent copy scanned in chart (See row information)    Current Medications (verified) Outpatient Encounter Medications as of 02/13/2024  Medication Sig   atorvastatin  (LIPITOR) 20 MG tablet TAKE 1 TABLET(20 MG) BY MOUTH DAILY.   hydrochlorothiazide  (HYDRODIURIL ) 25 MG tablet TAKE 1 TABLET(25 MG) BY MOUTH DAILY.   Multiple Vitamins-Minerals (CENTRUM ADULTS) TABS Take 1 tablet by mouth daily.   No facility-administered encounter medications on file as of 02/13/2024.    Allergies (verified) Patient has no known allergies.   History: Past Medical History:  Diagnosis  Date   Arthritis    BPH without obstruction/lower urinary tract symptoms    Elevated PSA 07/2018   Bx benign 05/16/22   History of GI bleed    upper and lower->? d/t ASA   Hyperlipidemia    started statin 07/2018; great response.   Positive TB test    postive skin test as child; [place] was [place] in the [place] , no BCG has been given to him.  Pt reports that he was never required to take any TB treatment.  He says he had multiple x-rays and they never found anything.  Suspect false positive TB skin test??  No old records available.  Pt is self pay, so the Quant Gold TB test is too cost prohibitive.  No known TB-like illness, no known contacts with TB pt   White coat syndrome with hypertension    Past Surgical History:  Procedure Laterality Date   COLONOSCOPY  2006; 12/23/18;02/18/19   12/23/18 colonoscopy aborted due to poor prep and altered anatomy (iliocolonic anastamosis->hx of surgery for gastroschisis). Rpt/double prep-02/2019 still not adequate prep->R col poorly seen->consider virtual clspy. Iliocolonic anast with eryth and inf polyps.   GASTROSCHISIS CLOSURE  1955   HEMANGIOMA EXCISION  2015   from 5th digit right hand   PROSTATE BIOPSY     05/16/22 benign   TONSILLECTOMY  1963   Family History  Problem Relation Age of Onset   Skin cancer Mother    Hyperlipidemia Mother    Heart attack Mother 31   Lung cancer Father  smoker   Arthritis Sister    Alzheimer's disease Maternal Grandmother    Heart disease Maternal Grandmother    Hyperlipidemia Maternal Grandmother    Hypertension Maternal Grandmother    Heart attack Maternal Grandmother    Lung cancer Maternal Grandfather        smoker   Heart attack Maternal Grandfather    Stomach cancer Paternal Grandmother    Heart attack Paternal Grandfather 57   Colon cancer Neg Hx    Esophageal cancer Neg Hx    Rectal cancer Neg Hx    Colon polyps Neg Hx    Social History   Socioeconomic History   Marital status: Married    Spouse  name: Not on file   Number of children: Not on file   Years of education: Not on file   Highest education level: Associate degree: academic program  Occupational History   Not on file  Tobacco Use   Smoking status: Never   Smokeless tobacco: Never  Vaping Use   Vaping status: Never Used  Substance and Sexual Activity   Alcohol use: Yes    Comment: 1-2 glasses of wine daily   Drug use: Not Currently   Sexual activity: Not on file  Other Topics Concern   Not on file  Social History Narrative   Married, 3 sons---Michael Cox, Michael Cox.   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   No tob/drugs.   Alc: 1 glass wine per evening.   Social Drivers of Corporate investment banker Strain: Low Risk  (02/13/2024)   Overall Financial Resource Strain (CARDIA)    Difficulty of Paying Living Expenses: Not hard at all  Food Insecurity: No Food Insecurity (02/13/2024)   Hunger Vital Sign    Worried About Running Out of Food in the Last Year: Never true    Ran Out of Food in the Last Year: Never true  Transportation Needs: No Transportation Needs (02/13/2024)   PRAPARE - Administrator, Civil Service (Medical): No    Lack of Transportation (Non-Medical): No  Physical Activity: Sufficiently Active (02/13/2024)   Exercise Vital Sign    Days of Exercise per Week: 4 days    Minutes of Exercise per Session: 50 min  Stress: No Stress Concern Present (02/13/2024)   [employer] of Occupational Health - Occupational Stress Questionnaire    Feeling of Stress: Not at all  Social Connections: Socially Integrated (02/13/2024)   Social Connection and Isolation Panel    Frequency of Communication with Friends and Family: More than three times a week    Frequency of Social Gatherings with Friends and Family: More than three times a week    Attends Religious Services: More than 4 times per year    Active Member of [employer] or Organizations: Yes     Attends Engineer, structural: More than 4 times per year    Marital Status: Married    Tobacco Counseling Counseling given: Not Answered   Clinical Intake:  Pre-visit preparation completed: Yes  Pain : No/denies pain     Diabetes: No  How often do you need to have someone help you when you read instructions, pamphlets, or other written materials from your doctor or pharmacy?: 1 - Never  Interpreter Needed?: No  Information entered by :: Mliss Graff LPN   Activities of Daily Living    02/13/2024    3:12 PM  In your present state of health, do you  have any difficulty performing the following activities:  Hearing? 0  Vision? 0  Difficulty concentrating or making decisions? 0  Walking or climbing stairs? 0  Dressing or bathing? 0  Doing errands, shopping? 0  Preparing Food and eating ? N  Using the Toilet? N  In the past six months, have you accidently leaked urine? N  Do you have problems with loss of bowel control? N  Managing your Medications? N  Managing your Finances? N  Housekeeping or managing your Housekeeping? N    Patient Care Team: Candise Aleene DEL, MD as PCP - General (Family Medicine) Armbruster, Elspeth SQUIBB, MD as Consulting Physician (Gastroenterology) Carolee Sherwood JONETTA DOUGLAS, MD as Consulting Physician (Urology)  Indicate any recent Medical Services you may have received from other than [facility] providers in the past year (date may be approximate).     Assessment:   This is a routine wellness examination for Michael Cox.  Hearing/Vision screen Hearing Screening - Comments:: No trouble hearing Vision Screening - Comments:: Up to date [facility]   Goals Addressed             This Visit's Progress    Weight (lb) < 200 lb (90.7 kg)   213 lb (96.6 kg)      Depression Screen    02/13/2024    3:12 PM 05/14/2023   10:25 AM 02/07/2023    3:16 PM 03/15/2022    2:50 PM 09/20/2021    2:24 PM 01/21/2021    9:41 AM 10/29/2019   10:22 AM  PHQ 2/9  Scores  PHQ - 2 Score 0 0 0 0 0 0 0  PHQ- 9 Score 0          Fall Risk    02/13/2024    3:23 PM 05/13/2023    4:47 PM 02/07/2023    3:18 PM 03/15/2022    2:50 PM 09/20/2021    2:27 PM  Fall Risk   Falls in the past year? 0 0 0 0 0  Number falls in past yr: 0  0 0 0  Injury with Fall? 0  0 0 0  Risk for fall due to :   Impaired vision Impaired vision No Fall Risks  Follow up Falls evaluation completed;Education provided;Falls prevention discussed  Falls prevention discussed Falls evaluation completed  Falls evaluation completed      Data saved with a previous flowsheet row definition    MEDICARE RISK AT HOME: Medicare Risk at Home Any stairs in or around the home?: No If so, are there any without handrails?: No Home free of loose throw rugs in walkways, pet beds, electrical cords, etc?: Yes Adequate lighting in your home to reduce risk of falls?: Yes Life alert?: No Use of a cane, walker or w/c?: No Grab bars in the bathroom?: No Shower chair or bench in shower?: Yes Elevated toilet seat or a handicapped toilet?: Yes  TIMED UP AND GO:  Was the test performed?  No    Cognitive Function:        02/13/2024    3:11 PM 02/07/2023    3:18 PM 09/20/2021    2:27 PM  6CIT Screen  What Year? 0 points 0 points 0 points  What month? 0 points 0 points 0 points  What time? 0 points 0 points 0 points  Count back from 20 0 points 0 points 0 points  Months in reverse 0 points 0 points 0 points  Repeat phrase 0 points 0 points 0 points  Total Score 0 points 0 points 0 points    Immunizations Immunization History  Administered Date(s) Administered   Fluad Quad(high Dose 65+) 04/27/2020, 03/15/2022   Fluad Trivalent(High Dose 65+) 05/14/2023   Influenza,inj,Quad PF,6+ Mos 07/15/2018   Moderna SARS-COV2 Booster Vaccination 05/30/2020   Moderna Sars-Covid-2 Vaccination 08/12/2019, 09/11/2019   PNEUMOCOCCAL CONJUGATE-20 01/21/2021   Pneumococcal Polysaccharide-23 10/29/2019   Tdap  11/22/2013   Zoster Recombinant(Shingrix ) 07/15/2018, 01/07/2019   Zoster, Live 11/22/2013    TDAP status: Due, Education has been provided regarding the importance of this vaccine. Advised may receive this vaccine at local pharmacy or Health Dept. Aware to provide a copy of the vaccination record if obtained from local pharmacy or Health Dept. Verbalized acceptance and understanding.  Flu Vaccine status: Due, Education has been provided regarding the importance of this vaccine. Advised may receive this vaccine at local pharmacy or Health Dept. Aware to provide a copy of the vaccination record if obtained from local pharmacy or Health Dept. Verbalized acceptance and understanding.  Pneumococcal vaccine status: Up to date  Covid-19 vaccine status: Information provided on how to obtain vaccines.   Qualifies for Shingles Vaccine? No   Zostavax completed Yes   Shingrix  Completed?: Yes  Screening Tests Health Maintenance  Topic Date Due   COVID-19 Vaccine (4 - 2024-25 season) 03/11/2023   DTaP/Tdap/Td (2 - Td or Tdap) 11/23/2023   INFLUENZA VACCINE  02/08/2024   Medicare Annual Wellness (AWV)  02/12/2025   Colonoscopy  02/17/2029   Pneumococcal Vaccine: 50+ Years  Completed   Zoster Vaccines- Shingrix   Completed   Hepatitis B Vaccines  Aged Out   HPV VACCINES  Aged Out   Meningococcal B Vaccine  Aged Out   Hepatitis C Screening  Discontinued    Health Maintenance  Health Maintenance Due  Topic Date Due   COVID-19 Vaccine (4 - 2024-25 season) 03/11/2023   DTaP/Tdap/Td (2 - Td or Tdap) 11/23/2023   INFLUENZA VACCINE  02/08/2024    Colorectal cancer screening: Type of screening: Colonoscopy. Completed 2021. Repeat every 5 years  Lung Cancer Screening: (Low Dose CT Chest recommended if Age 8-80 years, 20 pack-year currently smoking OR have quit w/in 15years.) does not qualify.   Lung Cancer Screening Referral:   Additional Screening:  Hepatitis C Screening:  never  done  Vision Screening: Recommended annual ophthalmology exams for early detection of glaucoma and other disorders of the eye. Is the patient up to date with their annual eye exam?  Yes  Who is the provider or what is the name of the office in which the patient attends annual eye exams? [facility] If pt is not established with a provider, would they like to be referred to a provider to establish care? No .   Dental Screening: Recommended annual dental exams for proper oral hygiene   Community Resource Referral / Chronic Care Management: CRR required this visit?  No   CCM required this visit?  No     Plan:     I have personally reviewed and noted the following in the patient's chart:   Medical and social history Use of alcohol, tobacco or illicit drugs  Current medications and supplements including opioid prescriptions. Patient is not currently taking opioid prescriptions. Functional ability and status Nutritional status Physical activity Advanced directives List of other physicians Hospitalizations, surgeries, and ER visits in previous 12 months Vitals Screenings to include cognitive, depression, and falls Referrals and appointments  In addition, I have reviewed and discussed with patient  certain preventive protocols, quality metrics, and best practice recommendations. A written personalized care plan for preventive services as well as general preventive health recommendations were provided to patient.     Mliss Graff, LPN   07/12/7972   After Visit Summary: (MyChart) Due to this being a telephonic visit, the after visit summary with patients personalized plan was offered to patient via MyChart   Nurse Notes:

## 2024-02-13 NOTE — Patient Instructions (Signed)
 [name] , Thank you for taking time to come for your Medicare Wellness Visit. I appreciate your ongoing commitment to your health goals. Please review the following plan we discussed and let me know if I can assist you in the future.   Screening recommendations/referrals: Colonoscopy: up to date Recommended yearly ophthalmology/optometry visit for glaucoma screening and checkup Recommended yearly dental visit for hygiene and checkup  Vaccinations: Influenza vaccine: Education provided Pneumococcal vaccine: up to date Tdap vaccine: Education provided Shingles vaccine: up to date        Preventive Care 65 Years and Older, Male Preventive care refers to lifestyle choices and visits with your health care provider that can promote health and wellness. What does preventive care include? A yearly physical exam. This is also called an annual well check. Dental exams once or twice a year. Routine eye exams. Ask your health care provider how often you should have your eyes checked. Personal lifestyle choices, including: Daily care of your teeth and gums. Regular physical activity. Eating a healthy diet. Avoiding tobacco and drug use. Limiting alcohol use. Practicing safe sex. Taking low doses of aspirin every day. Taking vitamin and mineral supplements as recommended by your health care provider. What happens during an annual well check? The services and screenings done by your health care provider during your annual well check will depend on your age, overall health, lifestyle risk factors, and family history of disease. Counseling  Your health care provider may ask you questions about your: Alcohol use. Tobacco use. Drug use. Emotional well-being. Home and relationship well-being. Sexual activity. Eating habits. History of falls. Memory and ability to understand (cognition). Work and work Astronomer. Screening  You may have the following tests or measurements: Height,  weight, and BMI. Blood pressure. Lipid and cholesterol levels. These may be checked every 5 years, or more frequently if you are over 39 years old. Skin check. Lung cancer screening. You may have this screening every year starting at age 6 if you have a 30-pack-year history of smoking and currently smoke or have quit within the past 15 years. Fecal occult blood test (FOBT) of the stool. You may have this test every year starting at age 68. Flexible sigmoidoscopy or colonoscopy. You may have a sigmoidoscopy every 5 years or a colonoscopy every 10 years starting at age 58. Prostate cancer screening. Recommendations will vary depending on your family history and other risks. Hepatitis C blood test. Hepatitis B blood test. Sexually transmitted disease (STD) testing. Diabetes screening. This is done by checking your blood sugar (glucose) after you have not eaten for a while (fasting). You may have this done every 1-3 years. Abdominal aortic aneurysm (AAA) screening. You may need this if you are a current or former smoker. Osteoporosis. You may be screened starting at age 46 if you are at high risk. Talk with your health care provider about your test results, treatment options, and if necessary, the need for more tests. Vaccines  Your health care provider may recommend certain vaccines, such as: Influenza vaccine. This is recommended every year. Tetanus, diphtheria, and acellular pertussis (Tdap, Td) vaccine. You may need a Td booster every 10 years. Zoster vaccine. You may need this after age 39. Pneumococcal 13-valent conjugate (PCV13) vaccine. One dose is recommended after age 79. Pneumococcal polysaccharide (PPSV23) vaccine. One dose is recommended after age 58. Talk to your health care provider about which screenings and vaccines you need and how often you need them. This information is not intended  to replace advice given to you by your health care provider. Make sure you discuss any  questions you have with your health care provider. Document Released: 07/23/2015 Document Revised: 03/15/2016 Document Reviewed: 04/27/2015 Elsevier Interactive Patient Education  2017 [employer].  Fall Prevention in the Home Falls can cause injuries. They can happen to people of all ages. There are many things you can do to make your home safe and to help prevent falls. What can I do on the outside of my home? Regularly fix the edges of walkways and driveways and fix any cracks. Remove anything that might make you trip as you walk through a door, such as a raised step or threshold. Trim any bushes or trees on the path to your home. Use bright outdoor lighting. Clear any walking paths of anything that might make someone trip, such as rocks or tools. Regularly check to see if handrails are loose or broken. Make sure that both sides of any steps have handrails. Any raised decks and porches should have guardrails on the edges. Have any leaves, snow, or ice cleared regularly. Use sand or salt on walking paths during winter. Clean up any spills in your garage right away. This includes oil or grease spills. What can I do in the bathroom? Use night lights. Install grab bars by the toilet and in the tub and shower. Do not use towel bars as grab bars. Use non-skid mats or decals in the tub or shower. If you need to sit down in the shower, use a plastic, non-slip stool. Keep the floor dry. Clean up any water that spills on the floor as soon as it happens. Remove soap buildup in the tub or shower regularly. Attach bath mats securely with double-sided non-slip rug tape. Do not have throw rugs and other things on the floor that can make you trip. What can I do in the bedroom? Use night lights. Make sure that you have a light by your bed that is easy to reach. Do not use any sheets or blankets that are too big for your bed. They should not hang down onto the floor. Have a firm chair that has side  arms. You can use this for support while you get dressed. Do not have throw rugs and other things on the floor that can make you trip. What can I do in the kitchen? Clean up any spills right away. Avoid walking on wet floors. Keep items that you use a lot in easy-to-reach places. If you need to reach something above you, use a strong step stool that has a grab bar. Keep electrical cords out of the way. Do not use floor polish or wax that makes floors slippery. If you must use wax, use non-skid floor wax. Do not have throw rugs and other things on the floor that can make you trip. What can I do with my stairs? Do not leave any items on the stairs. Make sure that there are handrails on both sides of the stairs and use them. Fix handrails that are broken or loose. Make sure that handrails are as long as the stairways. Check any carpeting to make sure that it is firmly attached to the stairs. Fix any carpet that is loose or worn. Avoid having throw rugs at the top or bottom of the stairs. If you do have throw rugs, attach them to the floor with carpet tape. Make sure that you have a light switch at the top of the stairs and  the bottom of the stairs. If you do not have them, ask someone to add them for you. What else can I do to help prevent falls? Wear shoes that: Do not have high heels. Have rubber bottoms. Are comfortable and fit you well. Are closed at the toe. Do not wear sandals. If you use a stepladder: Make sure that it is fully opened. Do not climb a closed stepladder. Make sure that both sides of the stepladder are locked into place. Ask someone to hold it for you, if possible. Clearly mark and make sure that you can see: Any grab bars or handrails. First and last steps. Where the edge of each step is. Use tools that help you move around (mobility aids) if they are needed. These include: Canes. Walkers. Scooters. Crutches. Turn on the lights when you go into a dark area.  Replace any light bulbs as soon as they burn out. Set up your furniture so you have a clear path. Avoid moving your furniture around. If any of your floors are uneven, fix them. If there are any pets around you, be aware of where they are. Review your medicines with your doctor. Some medicines can make you feel dizzy. This can increase your chance of falling. Ask your doctor what other things that you can do to help prevent falls. This information is not intended to replace advice given to you by your health care provider. Make sure you discuss any questions you have with your health care provider. Document Released: 04/22/2009 Document Revised: 12/02/2015 Document Reviewed: 07/31/2014 Elsevier Interactive Patient Education  2017 [employer].

## 2024-05-13 ENCOUNTER — Other Ambulatory Visit: Payer: Self-pay | Admitting: Family Medicine

## 2024-06-12 ENCOUNTER — Other Ambulatory Visit: Payer: Self-pay | Admitting: Family Medicine

## 2024-06-16 ENCOUNTER — Other Ambulatory Visit: Payer: Self-pay

## 2024-06-16 MED ORDER — ATORVASTATIN CALCIUM 20 MG PO TABS: ORAL_TABLET | ORAL | 0 refills | Status: DC

## 2024-06-16 MED ORDER — HYDROCHLOROTHIAZIDE 25 MG PO TABS: ORAL_TABLET | ORAL | 0 refills | Status: DC

## 2024-07-12 ENCOUNTER — Other Ambulatory Visit: Payer: Self-pay | Admitting: Family Medicine

## 2024-07-14 ENCOUNTER — Other Ambulatory Visit: Payer: Self-pay

## 2024-07-14 MED ORDER — HYDROCHLOROTHIAZIDE 25 MG PO TABS: ORAL_TABLET | ORAL | 0 refills | Status: DC

## 2024-07-14 MED ORDER — ATORVASTATIN CALCIUM 20 MG PO TABS: ORAL_TABLET | ORAL | 0 refills | Status: DC

## 2024-08-13 ENCOUNTER — Other Ambulatory Visit: Payer: Self-pay

## 2024-08-13 MED ORDER — HYDROCHLOROTHIAZIDE 25 MG PO TABS: ORAL_TABLET | ORAL | 0 refills | Status: AC

## 2024-08-13 MED ORDER — ATORVASTATIN CALCIUM 20 MG PO TABS: ORAL_TABLET | ORAL | 0 refills | Status: AC

## 2024-08-13 NOTE — Telephone Encounter (Signed)
 Pt requesting med refill and scheduled for 02/17

## 2024-08-26 ENCOUNTER — Encounter: Admitting: Family Medicine

## 2025-02-18 ENCOUNTER — Encounter
